# Patient Record
Sex: Female | Born: 1975 | Race: White | Hispanic: No | Marital: Married | Smoking: Former smoker
Health system: Southern US, Community
[De-identification: ages and names within clinical notes are randomized; demographics above are authoritative.]

## PROBLEM LIST (undated history)

## (undated) DIAGNOSIS — M351 Other overlap syndromes: Secondary | ICD-10-CM

## (undated) DIAGNOSIS — M069 Rheumatoid arthritis, unspecified: Secondary | ICD-10-CM

## (undated) DIAGNOSIS — I10 Essential (primary) hypertension: Secondary | ICD-10-CM

## (undated) DIAGNOSIS — R000 Tachycardia, unspecified: Secondary | ICD-10-CM

## (undated) DIAGNOSIS — E0590 Thyrotoxicosis, unspecified without thyrotoxic crisis or storm: Secondary | ICD-10-CM

## (undated) HISTORY — PX: TUBAL LIGATION: SHX77

## (undated) HISTORY — PX: BILATERAL CARPAL TUNNEL RELEASE: SHX6508

## (undated) HISTORY — DX: Other overlap syndromes: M35.1

## (undated) HISTORY — DX: Essential (primary) hypertension: I10

---

## 2006-10-10 ENCOUNTER — Emergency Department: Payer: Self-pay | Admitting: Internal Medicine

## 2006-12-23 ENCOUNTER — Ambulatory Visit: Payer: Self-pay

## 2006-12-26 ENCOUNTER — Ambulatory Visit: Payer: Self-pay

## 2008-07-02 ENCOUNTER — Emergency Department: Payer: Self-pay | Admitting: Emergency Medicine

## 2010-10-21 IMAGING — CT CT ABD-PELV W/ CM
1 of 2 series · 15 of 32 positions shown, 20 images · non-contrast
Comparison: none

REASON FOR EXAM: (1) rlq pain; (2) rlq pain
COMMENTS:

[Series 3: appendicitis · axial · 0.77mm/px · z∈[+214,+631]mm · 15 of 157 slices shown, 20 images]
[im 9/157  soft-tissue]
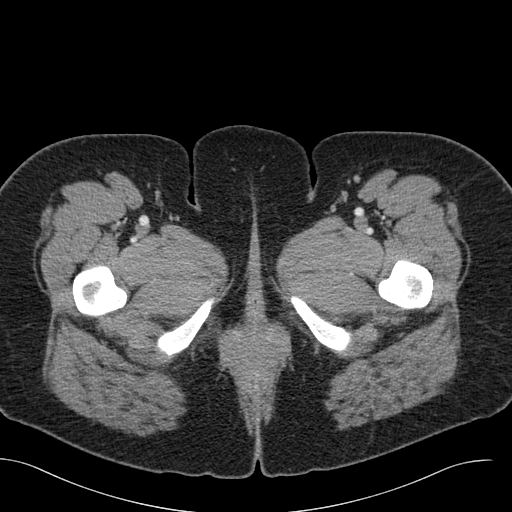
[im 9/157  bone]
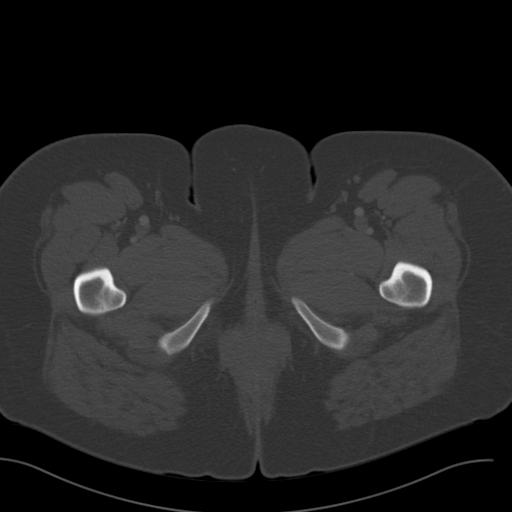
[im 17/157  soft-tissue]
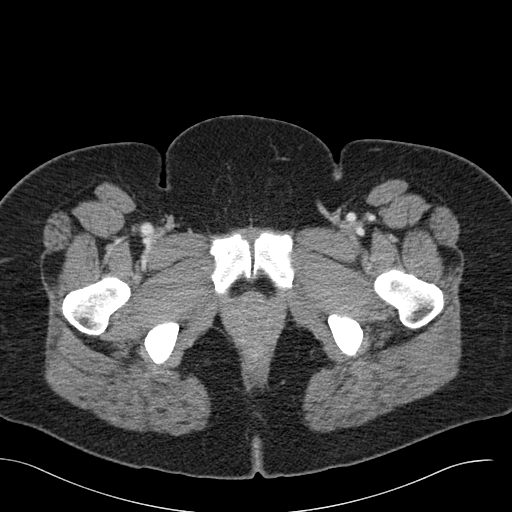
[im 33/157  soft-tissue]
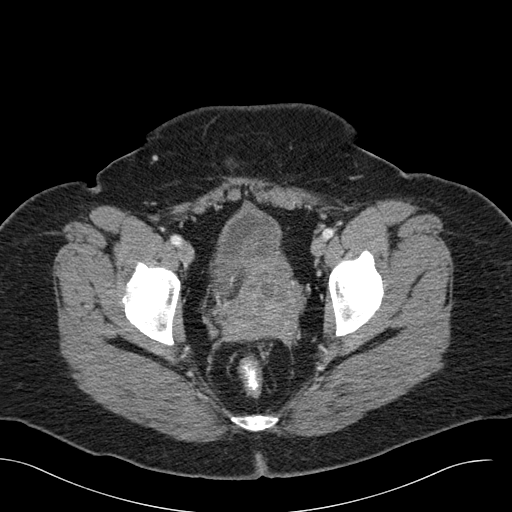
[im 42/157  soft-tissue]
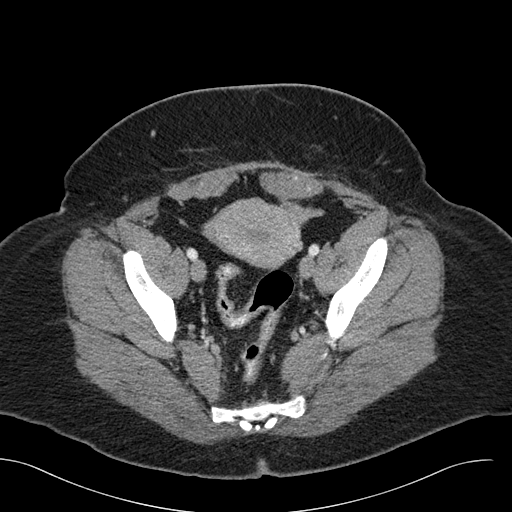
[im 50/157  soft-tissue]
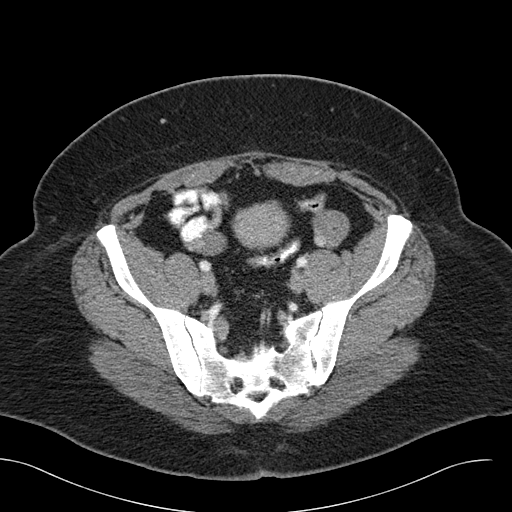
[im 66/157  soft-tissue]
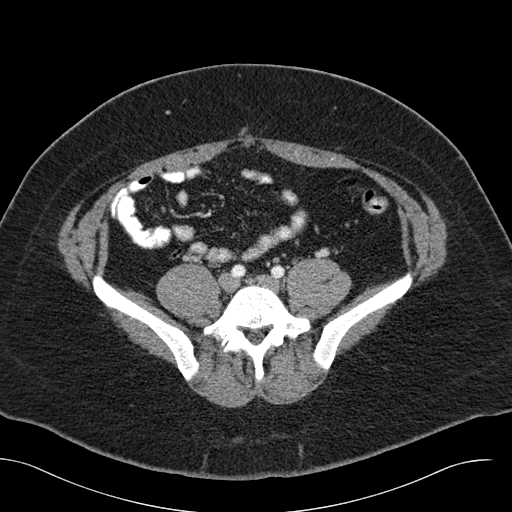
[im 74/157  soft-tissue]
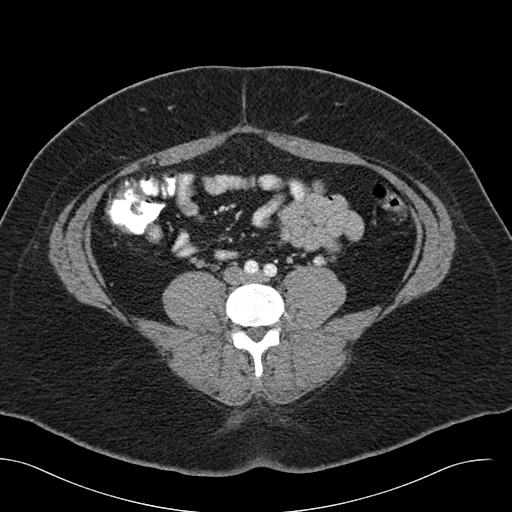
[im 83/157  soft-tissue]
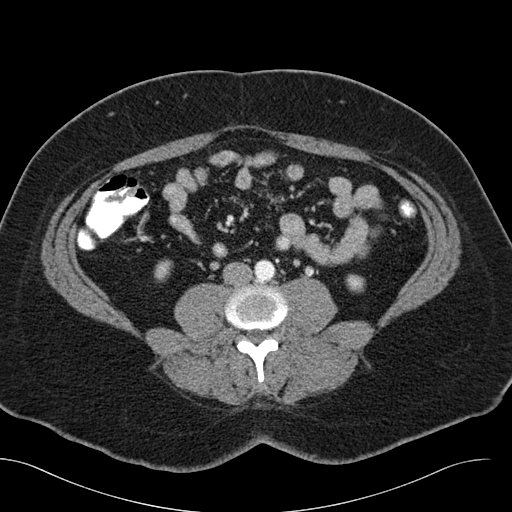
[im 91/157  soft-tissue]
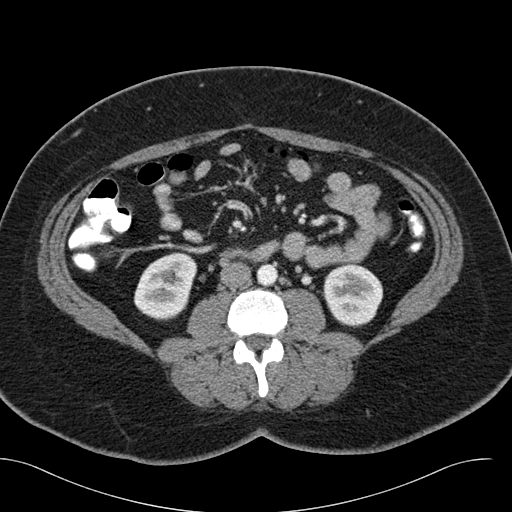
[im 91/157  bone]
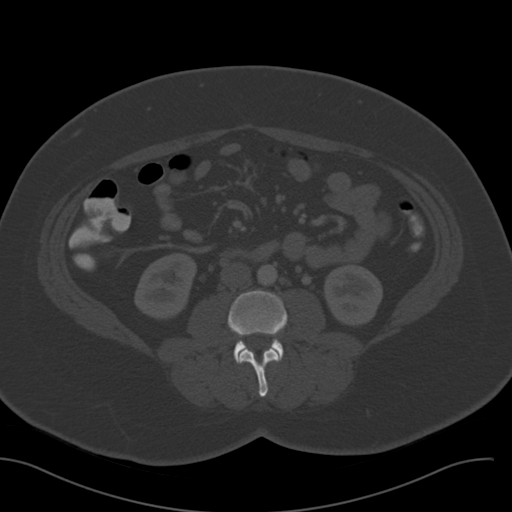
[im 107/157  soft-tissue]
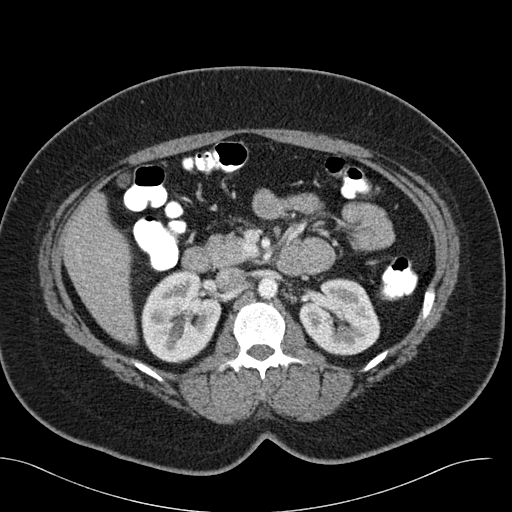
[im 115/157  soft-tissue]
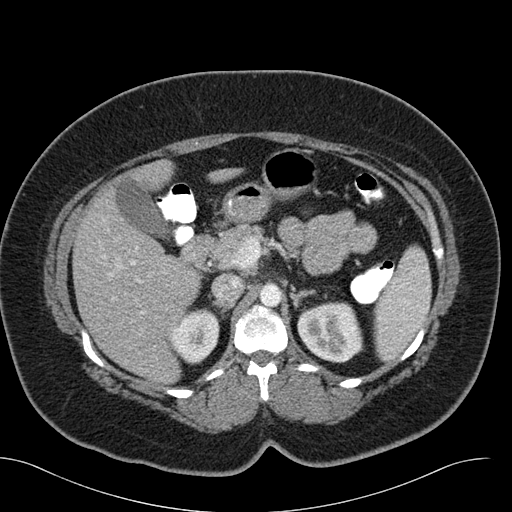
[im 124/157  soft-tissue]
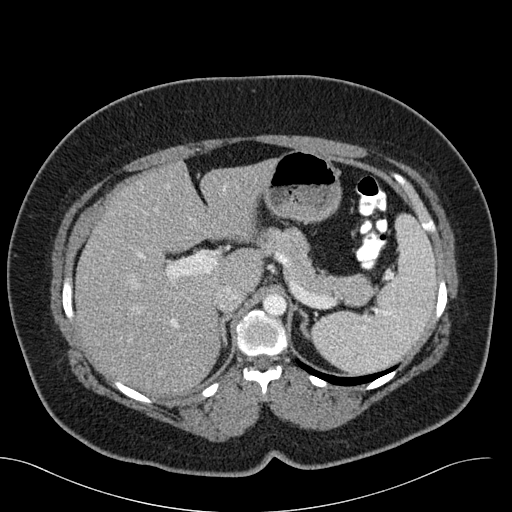
[im 124/157  lung]
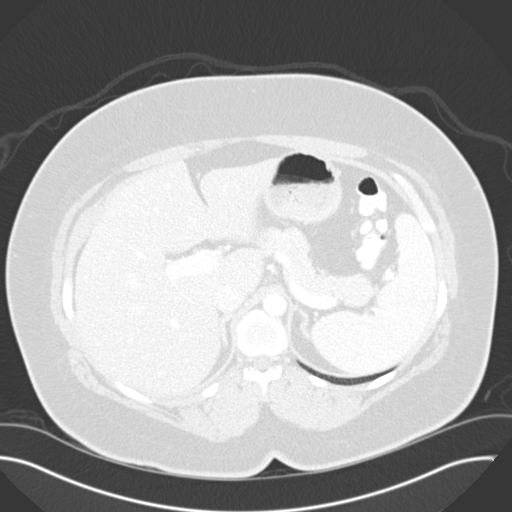
[im 132/157  lung]
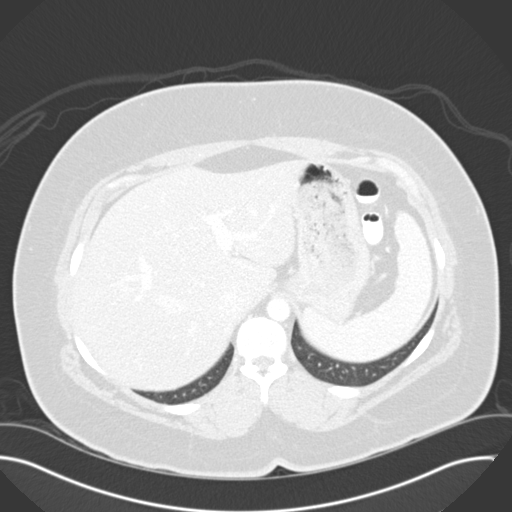
[im 140/157  soft-tissue]
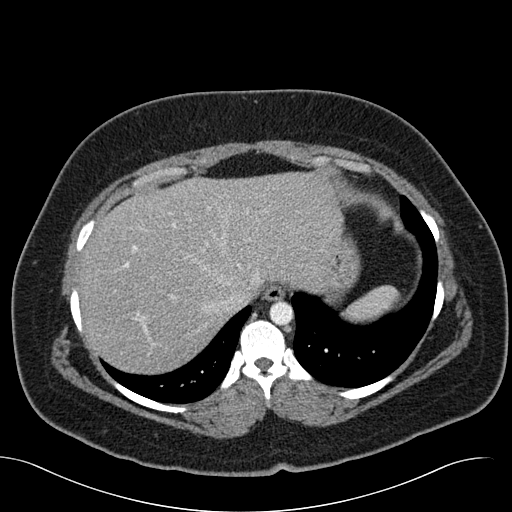
[im 140/157  lung]
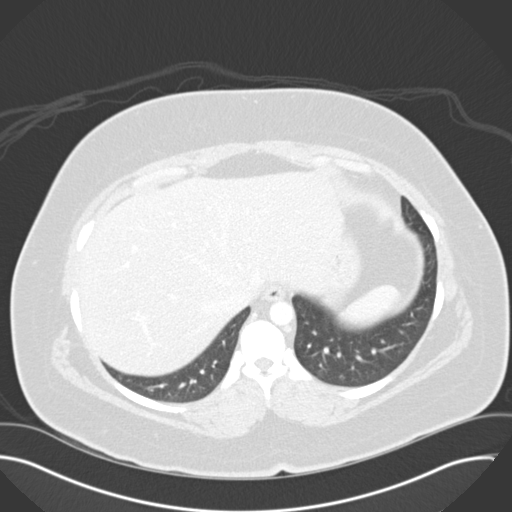
[im 148/157  soft-tissue]
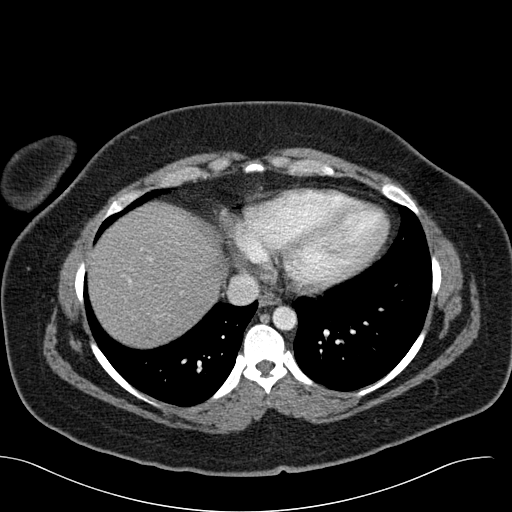
[im 148/157  lung]
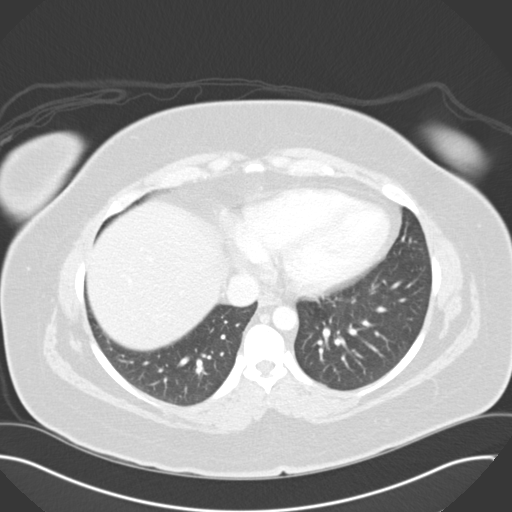

[15 of 32 positions shown; findings below may reference images not displayed]

PROCEDURE:     CT  - CT ABDOMEN / PELVIS  W  - July 02, 2008  [DATE]

RESULT:     CT of the abdomen and pelvis is performed utilizing oral
contrast and 100 ml of [id number] iodinated intravenous contrast. Images are
reconstructed at 3 mm slice thickness and compared to a study dated
10/10/2006.

There is no urinary obstruction or stone evident. There is no bowel
obstruction. There is no acute inflammation. The appendix is normal. The
aorta is unremarkable. There are multiple nonenlarged retroperitoneal lymph
nodes demonstrated. There is no abscess, ascites or free air. The urinary
bladder, liver, spleen, pancreas, kidneys, adrenal glands and gallbladder
appear unremarkable.
IMPRESSION: Unremarkable CT of the abdomen and pelvis.

## 2012-11-18 DIAGNOSIS — M545 Low back pain, unspecified: Secondary | ICD-10-CM | POA: Insufficient documentation

## 2013-01-06 DIAGNOSIS — R000 Tachycardia, unspecified: Secondary | ICD-10-CM | POA: Insufficient documentation

## 2013-01-07 DIAGNOSIS — R002 Palpitations: Secondary | ICD-10-CM | POA: Insufficient documentation

## 2013-06-15 DIAGNOSIS — E669 Obesity, unspecified: Secondary | ICD-10-CM | POA: Insufficient documentation

## 2013-06-15 DIAGNOSIS — E781 Pure hyperglyceridemia: Secondary | ICD-10-CM | POA: Insufficient documentation

## 2013-06-15 DIAGNOSIS — K219 Gastro-esophageal reflux disease without esophagitis: Secondary | ICD-10-CM | POA: Insufficient documentation

## 2013-06-15 DIAGNOSIS — E039 Hypothyroidism, unspecified: Secondary | ICD-10-CM | POA: Insufficient documentation

## 2013-06-15 DIAGNOSIS — M722 Plantar fascial fibromatosis: Secondary | ICD-10-CM | POA: Insufficient documentation

## 2013-06-15 DIAGNOSIS — L209 Atopic dermatitis, unspecified: Secondary | ICD-10-CM | POA: Insufficient documentation

## 2013-07-02 ENCOUNTER — Emergency Department: Payer: Self-pay | Admitting: Emergency Medicine

## 2013-07-02 LAB — CBC WITH DIFFERENTIAL/PLATELET
Basophil #: 0.1 10*3/uL (ref 0.0–0.1)
Basophil %: 0.9 %
EOS PCT: 1.8 %
Eosinophil #: 0.2 10*3/uL (ref 0.0–0.7)
HCT: 40.7 % (ref 35.0–47.0)
HGB: 13.5 g/dL (ref 12.0–16.0)
LYMPHS PCT: 26.3 %
Lymphocyte #: 2.8 10*3/uL (ref 1.0–3.6)
MCH: 29.4 pg (ref 26.0–34.0)
MCHC: 33.2 g/dL (ref 32.0–36.0)
MCV: 89 fL (ref 80–100)
MONO ABS: 0.8 x10 3/mm (ref 0.2–0.9)
Monocyte %: 7.8 %
NEUTROS ABS: 6.7 10*3/uL — AB (ref 1.4–6.5)
Neutrophil %: 63.2 %
Platelet: 353 10*3/uL (ref 150–440)
RBC: 4.59 10*6/uL (ref 3.80–5.20)
RDW: 13.1 % (ref 11.5–14.5)
WBC: 10.7 10*3/uL (ref 3.6–11.0)

## 2013-07-02 LAB — URINALYSIS, COMPLETE
BACTERIA: NONE SEEN
BILIRUBIN, UR: NEGATIVE
Blood: NEGATIVE
Glucose,UR: NEGATIVE mg/dL (ref 0–75)
NITRITE: NEGATIVE
PH: 5 (ref 4.5–8.0)
Protein: NEGATIVE
RBC,UR: 1 /HPF (ref 0–5)
Specific Gravity: 1.017 (ref 1.003–1.030)
WBC UR: NONE SEEN /HPF (ref 0–5)

## 2013-07-02 LAB — COMPREHENSIVE METABOLIC PANEL
ALK PHOS: 41 U/L — AB
ANION GAP: 7 (ref 7–16)
AST: 20 U/L (ref 15–37)
Albumin: 3.6 g/dL (ref 3.4–5.0)
BILIRUBIN TOTAL: 0.4 mg/dL (ref 0.2–1.0)
BUN: 12 mg/dL (ref 7–18)
Calcium, Total: 9.1 mg/dL (ref 8.5–10.1)
Chloride: 103 mmol/L (ref 98–107)
Co2: 28 mmol/L (ref 21–32)
Creatinine: 0.85 mg/dL (ref 0.60–1.30)
EGFR (African American): >60
EGFR (Non-African Amer.): >60
GLUCOSE: 85 mg/dL (ref 65–99)
Osmolality: 275 (ref 275–301)
Potassium: 4 mmol/L (ref 3.5–5.1)
SGPT (ALT): 27 U/L (ref 12–78)
Sodium: 138 mmol/L (ref 136–145)
Total Protein: 8 g/dL (ref 6.4–8.2)

## 2013-07-02 LAB — PREGNANCY, URINE: PREGNANCY TEST, URINE: NEGATIVE m[IU]/mL

## 2013-08-10 DIAGNOSIS — J309 Allergic rhinitis, unspecified: Secondary | ICD-10-CM | POA: Insufficient documentation

## 2015-10-21 IMAGING — US US PELV - US TRANSVAGINAL
1 series · 13 of 25 positions shown · non-contrast
Comparison: CT of the abdomen and pelvis performed 07/02/2008

CLINICAL DATA: Right lower quadrant abdominal pain.

EXAM:
TRANSABDOMINAL AND TRANSVAGINAL ULTRASOUND OF PELVIS
TECHNIQUE: Both transabdominal and transvaginal ultrasound examinations of the
pelvis were performed. Transabdominal technique was performed for
global imaging of the pelvis including uterus, ovaries, adnexal
regions, and pelvic cul-de-sac. It was necessary to proceed with
endovaginal exam following the transabdominal exam to visualize the
uterus and ovaries in greater detail.

[Series 1: us pelv - us transvaginal · 0.28mm/px · 13 of 50 slices shown]
[im 1/50]
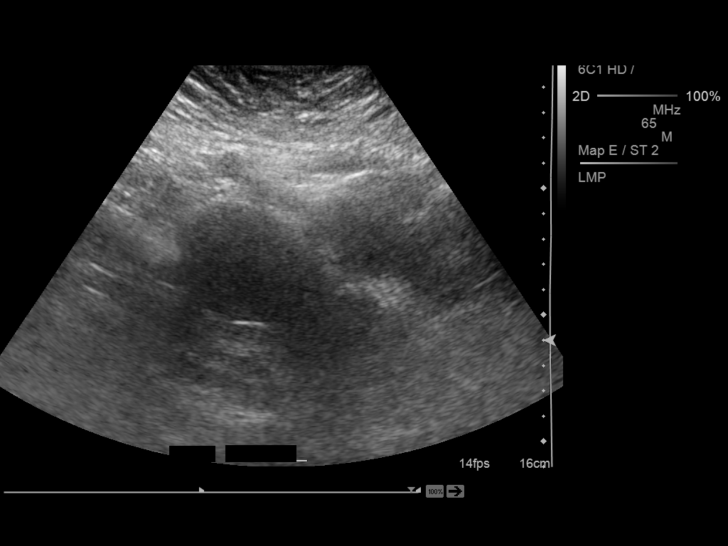
[im 5/50]
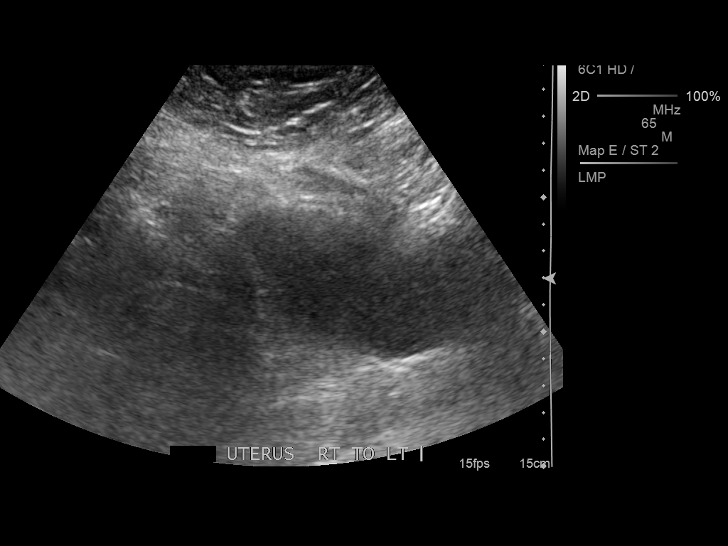
[im 9/50]
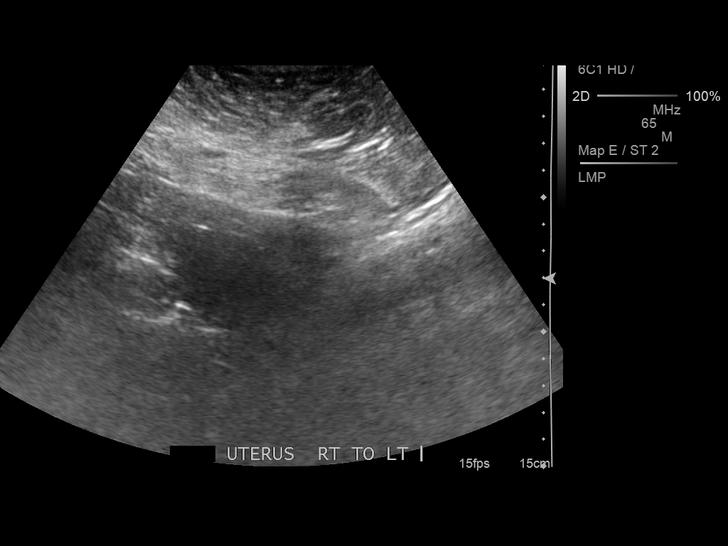
[im 13/50]
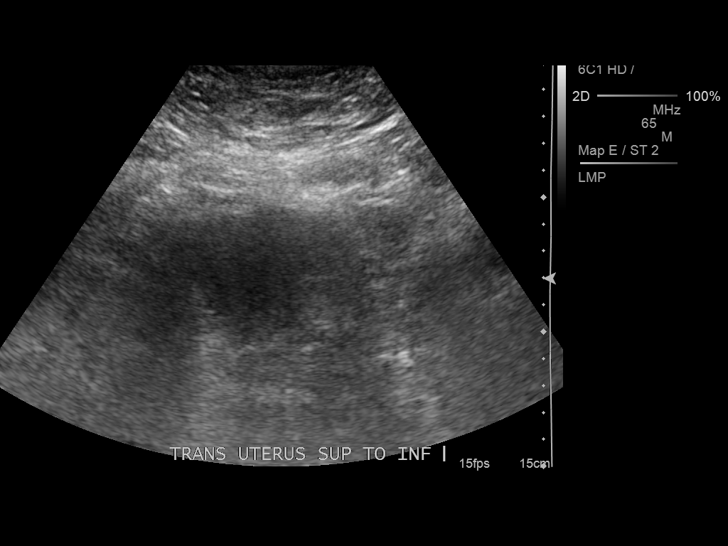
[im 17/50]
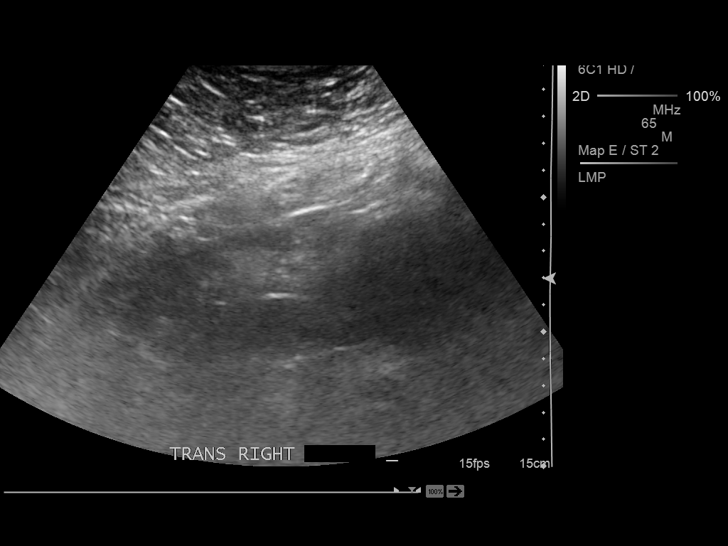
[im 21/50]
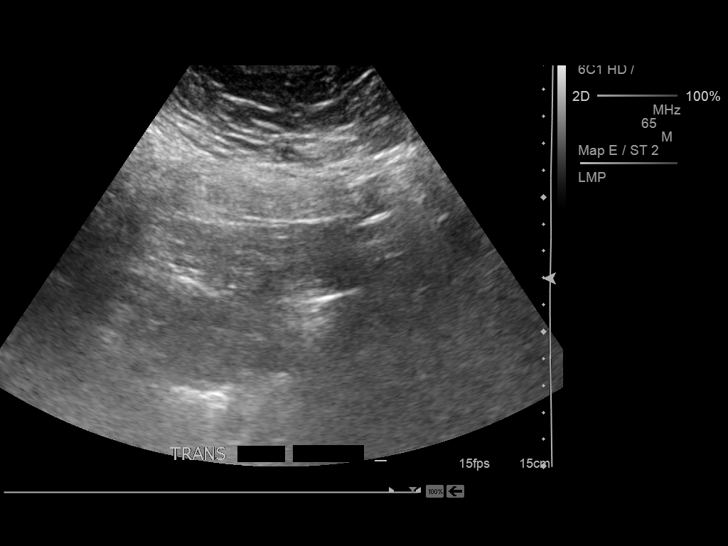
[im 25/50]
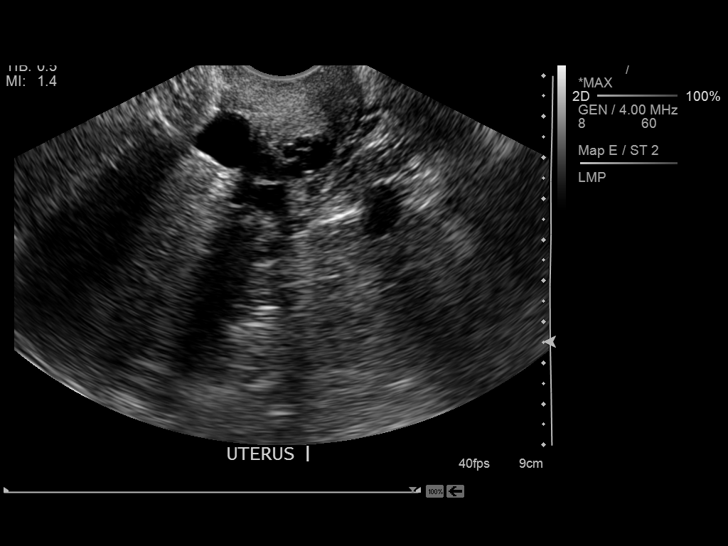
[im 29/50]
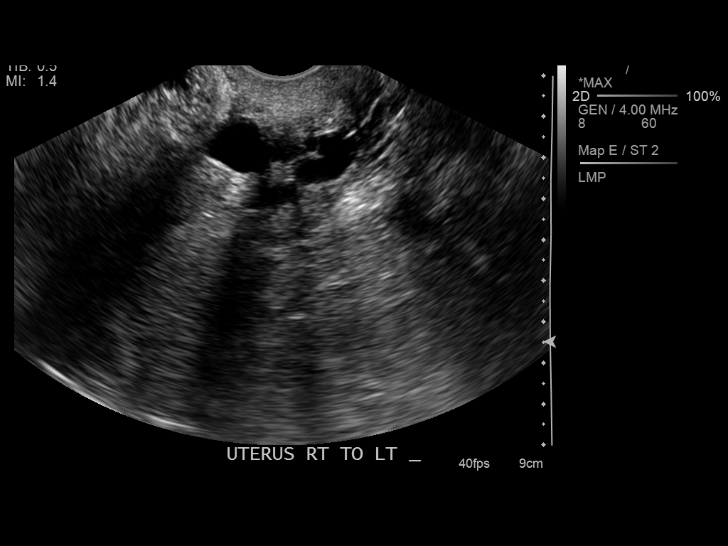
[im 33/50]
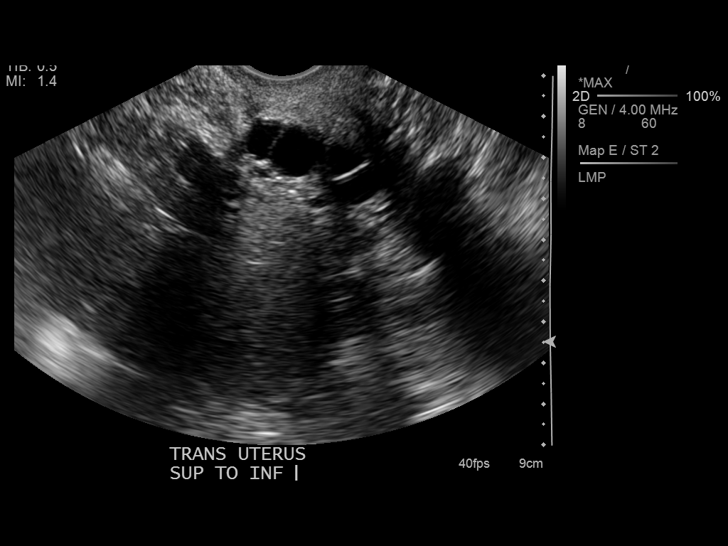
[im 37/50]
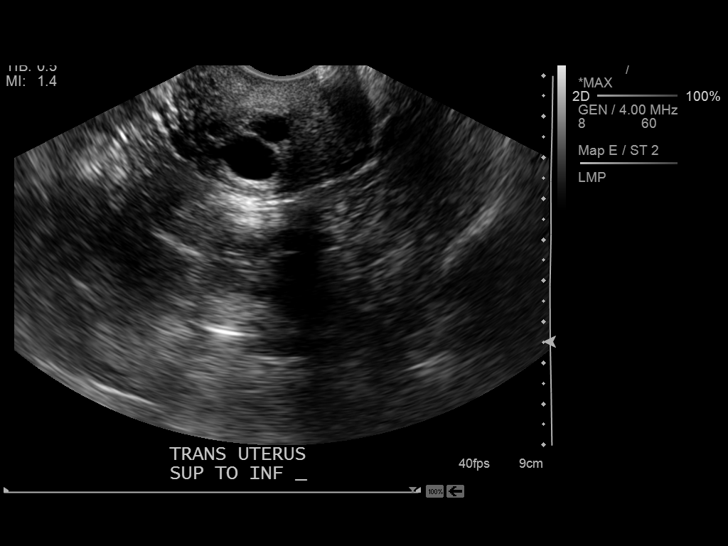
[im 41/50]
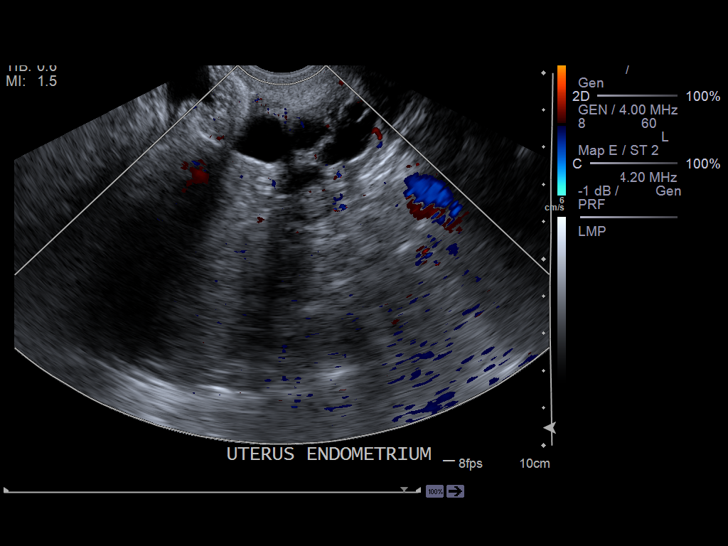
[im 45/50]
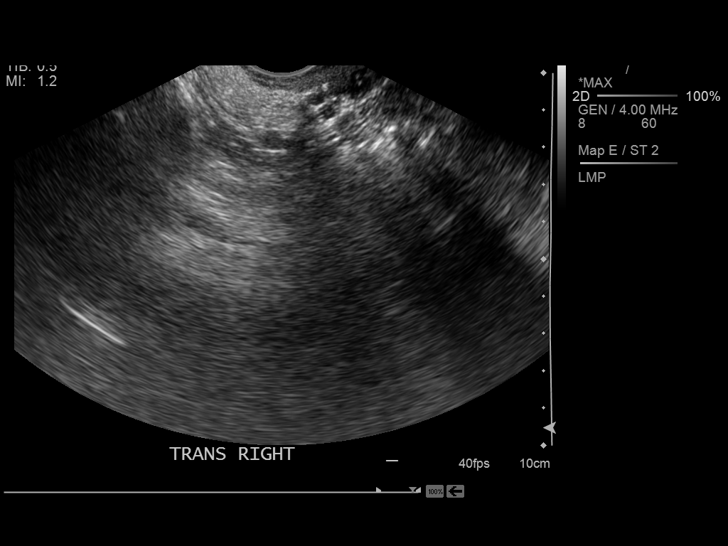
[im 50/50]
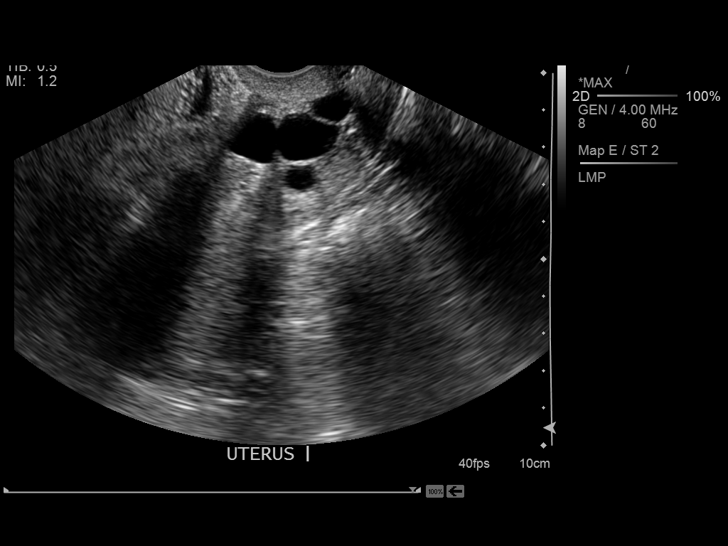

[13 of 25 positions shown; findings below may reference images not displayed]

FINDINGS: Uterus

Measurements: 8.2 x 4.4 x 5.1 cm. No fibroids or other mass
visualized. Several nabothian cysts are seen at the cervix.

Endometrium

Thickness: 1.5 cm. Diffusely thickened, without focal mass. The
patient describes a history of irregular menses due to thyroid
issues.

Right ovary

Not visualized.

Left ovary

Not visualized.

Other findings

No free fluid is seen within the pelvic cul-de-sac.
IMPRESSION: 1. Uterus remains normal in size. Mildly thickened endometrial echo
complex, without evidence of focal mass.
2. Ovaries not visualized due to the patient's habitus and overlying
bowel gas. No ancillary evidence for ovarian torsion.

## 2016-08-18 ENCOUNTER — Emergency Department: Payer: BC Managed Care – PPO

## 2016-08-18 ENCOUNTER — Emergency Department
Admission: EM | Admit: 2016-08-18 | Discharge: 2016-08-18 | Disposition: A | Discharge status: Home / Self Care | Payer: BC Managed Care – PPO | Attending: Emergency Medicine | Admitting: Emergency Medicine

## 2016-08-18 ENCOUNTER — Encounter: Payer: Self-pay | Admitting: Emergency Medicine

## 2016-08-18 DIAGNOSIS — R1032 Left lower quadrant pain: Secondary | ICD-10-CM

## 2016-08-18 DIAGNOSIS — Z87891 Personal history of nicotine dependence: Secondary | ICD-10-CM | POA: Diagnosis not present

## 2016-08-18 DIAGNOSIS — N200 Calculus of kidney: Secondary | ICD-10-CM | POA: Diagnosis not present

## 2016-08-18 HISTORY — DX: Tachycardia, unspecified: R00.0

## 2016-08-18 HISTORY — DX: Thyrotoxicosis, unspecified without thyrotoxic crisis or storm: E05.90

## 2016-08-18 HISTORY — DX: Rheumatoid arthritis, unspecified: M06.9

## 2016-08-18 LAB — COMPREHENSIVE METABOLIC PANEL
ALBUMIN: 3.5 g/dL (ref 3.5–5.0)
ALK PHOS: 38 U/L (ref 38–126)
ALT: 21 U/L (ref 14–54)
AST: 22 U/L (ref 15–41)
Anion gap: 6 (ref 5–15)
BILIRUBIN TOTAL: 0.4 mg/dL (ref 0.3–1.2)
BUN: 13 mg/dL (ref 6–20)
CO2: 26 mmol/L (ref 22–32)
Calcium: 8.5 mg/dL — ABNORMAL LOW (ref 8.9–10.3)
Chloride: 105 mmol/L (ref 101–111)
Creatinine, Ser: 0.67 mg/dL (ref 0.44–1.00)
GFR calc Af Amer: >60 mL/min (ref >60)
GFR calc non Af Amer: >60 mL/min (ref >60)
GLUCOSE: 126 mg/dL — AB (ref 65–99)
Potassium: 4.1 mmol/L (ref 3.5–5.1)
Sodium: 137 mmol/L (ref 135–145)
Total Protein: 7.3 g/dL (ref 6.5–8.1)

## 2016-08-18 LAB — CBC
HEMATOCRIT: 39.3 % (ref 35.0–47.0)
Hemoglobin: 13.5 g/dL (ref 12.0–16.0)
MCH: 29.3 pg (ref 26.0–34.0)
MCHC: 34.3 g/dL (ref 32.0–36.0)
MCV: 85.4 fL (ref 80.0–100.0)
Platelets: 345 10*3/uL (ref 150–440)
RBC: 4.6 MIL/uL (ref 3.80–5.20)
RDW: 13.7 % (ref 11.5–14.5)
WBC: 7.8 10*3/uL (ref 3.6–11.0)

## 2016-08-18 LAB — URINALYSIS, COMPLETE (UACMP) WITH MICROSCOPIC
BACTERIA UA: NONE SEEN
Bilirubin Urine: NEGATIVE
Glucose, UA: NEGATIVE mg/dL
Ketones, ur: NEGATIVE mg/dL
Leukocytes, UA: NEGATIVE
Nitrite: NEGATIVE
Protein, ur: 30 mg/dL — AB
SPECIFIC GRAVITY, URINE: 1.012 (ref 1.005–1.030)
pH: 5 (ref 5.0–8.0)

## 2016-08-18 LAB — LIPASE, BLOOD: Lipase: 21 U/L (ref 11–51)

## 2016-08-18 LAB — POCT PREGNANCY, URINE: Preg Test, Ur: NEGATIVE

## 2016-08-18 MED ORDER — ONDANSETRON 4 MG PO TBDP: 4.0000 mg | ORAL_TABLET | Freq: Three times a day (TID) | ORAL | 0 refills | Status: AC | PRN

## 2016-08-18 MED ORDER — OXYCODONE-ACETAMINOPHEN 5-325 MG PO TABS: 1.0000 | ORAL_TABLET | Freq: Four times a day (QID) | ORAL | 0 refills | Status: AC | PRN

## 2016-08-18 MED ORDER — OXYCODONE HCL 5 MG PO TABS
5.0000 mg | ORAL_TABLET | Freq: Once | ORAL | Status: AC
  Administered 2016-08-18: 5 mg via ORAL
  Filled 2016-08-18: qty 1

## 2016-08-18 MED ORDER — ACETAMINOPHEN 500 MG PO TABS
1000.0000 mg | ORAL_TABLET | Freq: Once | ORAL | Status: AC
  Administered 2016-08-18: 1000 mg via ORAL
  Filled 2016-08-18: qty 2

## 2016-08-18 MED ORDER — ONDANSETRON 4 MG PO TBDP: 4.0000 mg | ORAL_TABLET | Freq: Once | ORAL | Status: DC | PRN

## 2016-08-18 MED ORDER — IBUPROFEN 600 MG PO TABS: 600.0000 mg | ORAL_TABLET | Freq: Four times a day (QID) | ORAL | 0 refills | Status: AC | PRN

## 2016-08-18 MED ORDER — SODIUM CHLORIDE 0.9 % IV BOLUS (SEPSIS)
1000.0000 mL | Freq: Once | INTRAVENOUS | Status: AC
  Administered 2016-08-18: 1000 mL via INTRAVENOUS

## 2016-08-18 MED ORDER — ONDANSETRON HCL 4 MG/2ML IJ SOLN
4.0000 mg | Freq: Once | INTRAMUSCULAR | Status: AC
  Administered 2016-08-18: 4 mg via INTRAVENOUS
  Filled 2016-08-18: qty 2

## 2016-08-18 MED ORDER — KETOROLAC TROMETHAMINE 30 MG/ML IJ SOLN
30.0000 mg | Freq: Once | INTRAMUSCULAR | Status: AC
  Administered 2016-08-18: 30 mg via INTRAVENOUS
  Filled 2016-08-18: qty 1

## 2016-08-18 NOTE — ED Notes (Signed)
Patient transported to Ultrasound 

## 2016-08-18 NOTE — ED Provider Notes (Signed)
[facility] Emergency Department Provider Note  ____________________________________________  Time seen: Approximately 3:31 PM  I have reviewed the triage vital signs and the nursing notes.   HISTORY  Chief Complaint Abdominal Pain   HPI Jeanne Bush is a 41 y.o. female who presents for evaluation of left lower quadrant abdominal pain. Patient reports that the pain happened suddenly this morning after she had a bowel movement. The pain is dull, constant, severe, located in the left lower quadrant, radiating to her left lower back. She denies ever having similar pain in the past. Patient had one episode of vomiting and continues to feel nauseated. She also endorses urinary frequency for a week but no dysuria, no hematuria, no flank pain, no fever or chills, nor constipation or diarrhea. No vaginal discharge. Patient's prior abdominal surgery includes 2 C-sections. No abdominal distention. Currently she has 7/10 pain.  Past Medical History:  Diagnosis Date  . Hyperthyroidism   . Rheumatoid arthritis (HCC)   . Tachycardia     There are no active problems to display for this patient.   Past Surgical History:  Procedure Laterality Date  . CESAREAN SECTION    . TUBAL LIGATION      Prior to Admission medications   Medication Sig Start Date End Date Taking? Authorizing Provider  ibuprofen (ADVIL,MOTRIN) 600 MG tablet Take 1 tablet (600 mg total) by mouth every 6 (six) hours as needed for mild pain or moderate pain. 08/18/16   Nita SickleVeronese, [place], MD  ondansetron (ZOFRAN ODT) 4 MG disintegrating tablet Take 1 tablet (4 mg total) by mouth every 8 (eight) hours as needed for nausea or vomiting. 08/18/16   Don PerkingVeronese, [place], MD  oxyCODONE-acetaminophen (ROXICET) 5-325 MG tablet Take 1 tablet by mouth every 6 (six) hours as needed. 08/18/16 08/18/17  Nita SickleVeronese, [place], MD    Allergies Patient has no known allergies.  History reviewed. No pertinent family  history.  Social History Social History  Substance Use Topics  . Smoking status: Former Games developermoker  . Smokeless tobacco: Never Used  . Alcohol use Yes    Review of Systems  Constitutional: Negative for fever. Eyes: Negative for visual changes. ENT: Negative for sore throat. Neck: No neck pain  Cardiovascular: Negative for chest pain. Respiratory: Negative for shortness of breath. Gastrointestinal: + LLQ abdominal pain, nausea, and vomiting. No diarrhea. Genitourinary: Negative for dysuria. Musculoskeletal: Negative for back pain. Skin: Negative for rash. Neurological: Negative for headaches, weakness or numbness. Psych: No SI or HI  ____________________________________________   PHYSICAL EXAM:  VITAL SIGNS: ED Triage Vitals  Enc Vitals Group     BP 08/18/16 1342 130/83     Pulse Rate 08/18/16 1340 83     Resp 08/18/16 1340 (!) 24     Temp 08/18/16 1341 98.4 F (36.9 C)     Temp Source 08/18/16 1341 Oral     SpO2 08/18/16 1340 99 %     Weight 08/18/16 1340 228 lb (103.4 kg)     Height 08/18/16 1340 5\' 1"  (1.549 m)     Head Circumference --      Peak Flow --      Pain Score 08/18/16 1340 9     Pain Loc --      Pain Edu? --      Excl. in GC? --     Constitutional: Alert and oriented. Well appearing and in no apparent distress. HEENT:      Head: Normocephalic and atraumatic.  Eyes: Conjunctivae are normal. Sclera is non-icteric.       Mouth/Throat: Mucous membranes are moist.       Neck: Supple with no signs of meningismus. Cardiovascular: Regular rate and rhythm. No murmurs, gallops, or rubs. 2+ symmetrical distal pulses are present in all extremities. No JVD. Respiratory: Normal respiratory effort. Lungs are clear to auscultation bilaterally. No wheezes, crackles, or rhonchi.  Gastrointestinal: Soft, mild LLQ tenderness to palpation, and non distended with positive bowel sounds. No rebound or guarding. Genitourinary: No CVA tenderness. Musculoskeletal:  Nontender with normal range of motion in all extremities. No edema, cyanosis, or erythema of extremities. Neurologic: Normal speech and language. Face is symmetric. Moving all extremities. No gross focal neurologic deficits are appreciated. Skin: Skin is warm, dry and intact. No rash noted. Psychiatric: Mood and affect are normal. Speech and behavior are normal.  ____________________________________________   LABS (all labs ordered are listed, but only abnormal results are displayed)  Labs Reviewed  COMPREHENSIVE METABOLIC PANEL - Abnormal; Notable for the following:       Result Value   Glucose, Bld 126 (*)    Calcium 8.5 (*)    All other components within normal limits  URINALYSIS, COMPLETE (UACMP) WITH MICROSCOPIC - Abnormal; Notable for the following:    Color, Urine YELLOW (*)    APPearance HAZY (*)    Hgb urine dipstick LARGE (*)    Protein, ur 30 (*)    Squamous Epithelial / LPF 0-5 (*)    All other components within normal limits  LIPASE, BLOOD  CBC  POC URINE PREG, ED  POCT PREGNANCY, URINE   ____________________________________________  EKG  none  ____________________________________________  RADIOLOGY  TVUS: Stable nabothian cysts from prior exam.  Nonvisualization of left ovary.  Normal-appearing right ovary and uterus.  CT renal:  2 mm stone at the left UVJ causing low-grade obstruction ____________________________________________   PROCEDURES  Procedure(s) performed: None Procedures Critical Care performed:  None ____________________________________________   INITIAL IMPRESSION / ASSESSMENT AND PLAN / ED COURSE  41 y.o. female who presents for evaluation of sudden onset of left lower quadrant abdominal pain radiating to left lower back. Patient is well-appearing, in no distress, has normal vital signs, she has mild left lower quadrant tenderness palpation with no rebound or guarding, no flank tenderness. Blood work showing normal CBC, CMP,  lipase, negative pregnancy test. Differential diagnoses including ovarian pathology such as torsion or hemorrhagic cyst vs kidney stone vs diverticulitis. Plan for TVUS, UA pending.   Clinical Course as of Aug 19 1847  Sat Aug 18, 2016  1753 Ultrasound negative for ovarian pathology however unable to visualize the left ovary. UA with large amount of blood. We'll sent for CT renal to rule out kidney stone.  [CV]  1847 CT showing 2 mm left-sided ureteral stone at the left UVJ. Patient reports that her pain is markedly improved at this time. We'll switch to by mouth pain medication and discharged home. No evidence of overlying infection. Normal kidney function.  [CV]    Clinical Course User Index [CV] Nita Sickle, MD    Pertinent labs & imaging results that were available during my care of the patient were reviewed by me and considered in my medical decision making (see chart for details).    ____________________________________________   FINAL CLINICAL IMPRESSION(S) / ED DIAGNOSES  Final diagnoses:  LLQ abdominal pain  Kidney stone      NEW MEDICATIONS STARTED DURING THIS VISIT:  New Prescriptions   IBUPROFEN (ADVIL,MOTRIN)  600 MG TABLET    Take 1 tablet (600 mg total) by mouth every 6 (six) hours as needed for mild pain or moderate pain.   ONDANSETRON (ZOFRAN ODT) 4 MG DISINTEGRATING TABLET    Take 1 tablet (4 mg total) by mouth every 8 (eight) hours as needed for nausea or vomiting.   OXYCODONE-ACETAMINOPHEN (ROXICET) 5-325 MG TABLET    Take 1 tablet by mouth every 6 (six) hours as needed.     Note:  This document was prepared using Dragon voice recognition software and may include unintentional dictation errors.    Nita Sickle, MD 08/18/16 [phone]

## 2016-08-18 NOTE — ED Triage Notes (Signed)
Pt c/o acute left lower abdominal pain that started today. Nausea but no vomiting.  Denies urinary symptoms.  No fevers that pt knows of.  Restless in triage.  Pain started after bowel movement today.

## 2016-08-18 NOTE — Discharge Instructions (Signed)

## 2016-10-12 DIAGNOSIS — M05741 Rheumatoid arthritis with rheumatoid factor of right hand without organ or systems involvement: Secondary | ICD-10-CM | POA: Insufficient documentation

## 2017-01-15 DIAGNOSIS — H5213 Myopia, bilateral: Secondary | ICD-10-CM | POA: Insufficient documentation

## 2017-01-15 DIAGNOSIS — T378X5A Adverse effect of other specified systemic anti-infectives and antiparasitics, initial encounter: Secondary | ICD-10-CM | POA: Insufficient documentation

## 2018-07-03 IMAGING — US US TRANSVAGINAL NON-OB
1 series · 14 of 25 positions shown · non-contrast
Comparison: 07/02/2013

CLINICAL DATA: Left lower quadrant pain

EXAM:
TRANSABDOMINAL AND TRANSVAGINAL ULTRASOUND OF PELVIS
DOPPLER ULTRASOUND OF OVARIES
TECHNIQUE: Both transabdominal and transvaginal ultrasound examinations of the
pelvis were performed. Transabdominal technique was performed for
global imaging of the pelvis including uterus, ovaries, adnexal
regions, and pelvic cul-de-sac.
It was necessary to proceed with endovaginal exam following the
transabdominal exam to visualize the ovaries. Color and duplex
Doppler ultrasound was utilized to evaluate blood flow to the
ovaries.

[Series 1: us transvaginal non-ob · 0.26mm/px · 14 of 77 slices shown]
[im 1/77]
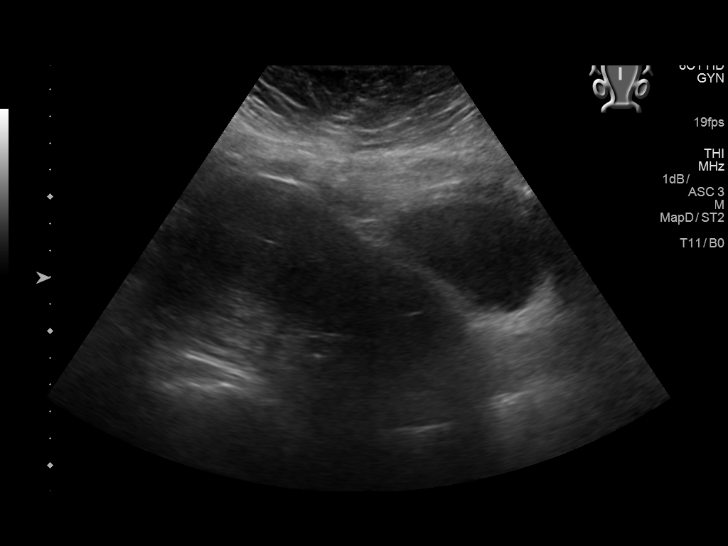
[im 7/77]
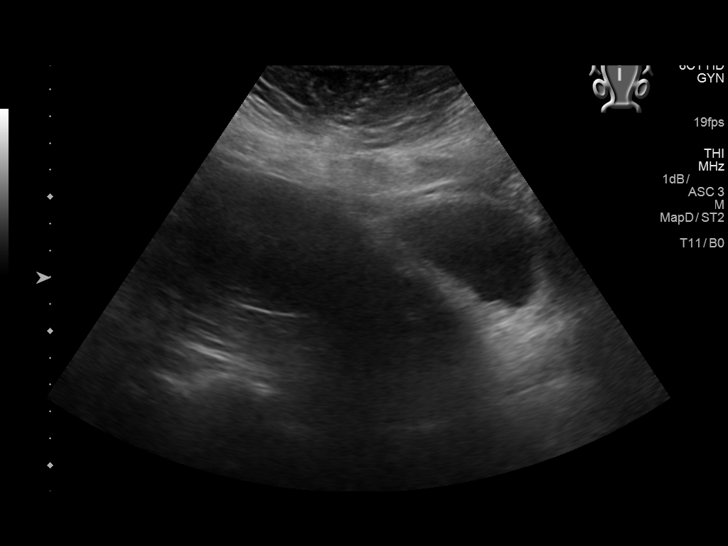
[im 13/77]
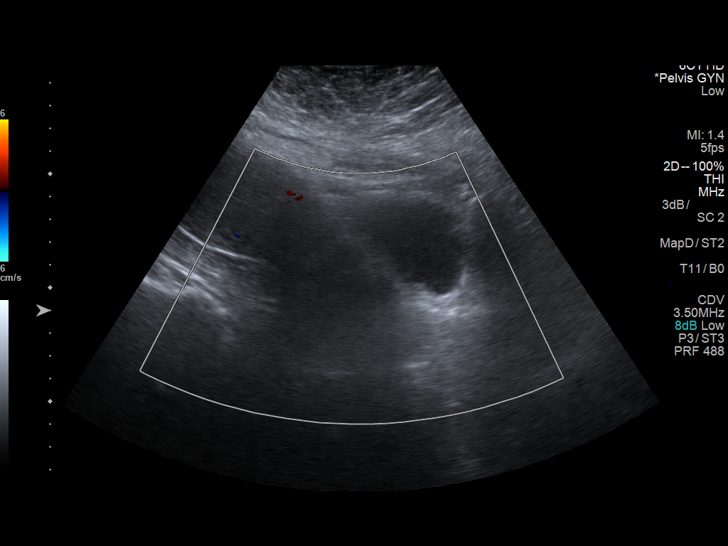
[im 20/77]
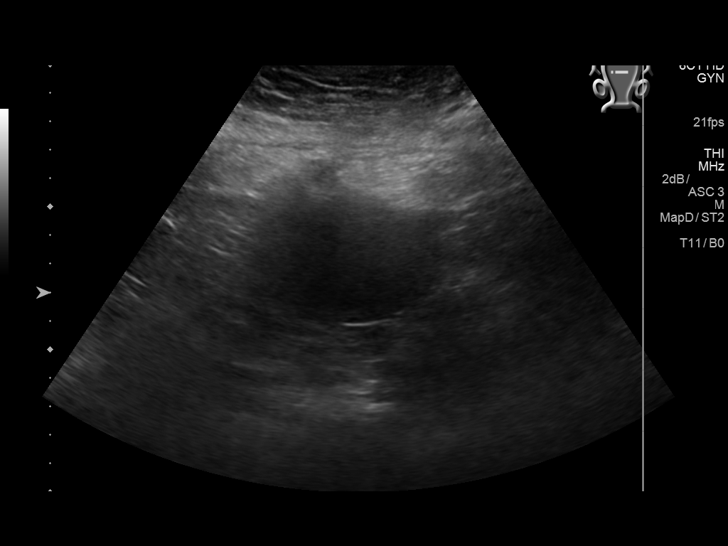
[im 26/77]
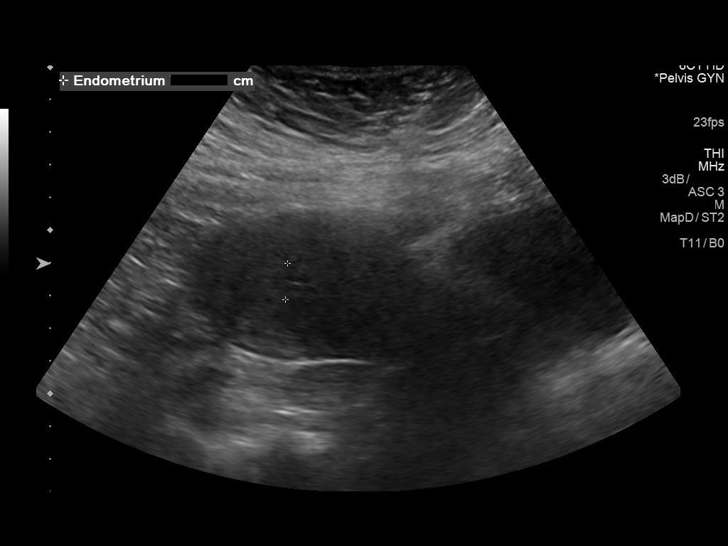
[im 29/77]
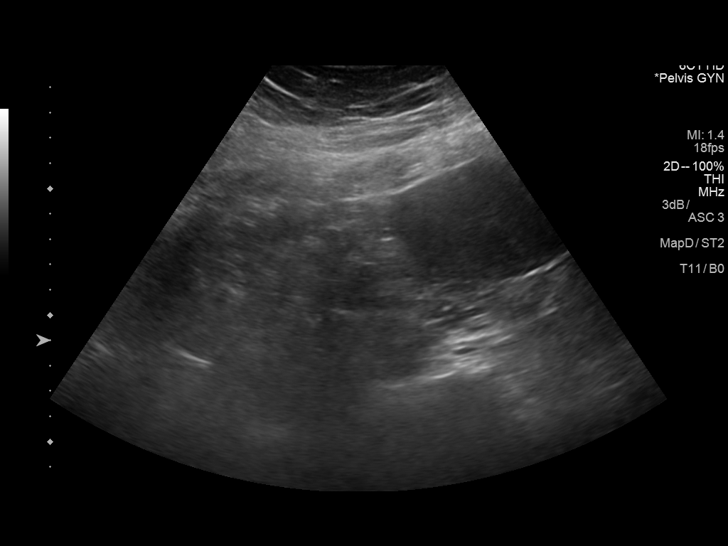
[im 35/77]
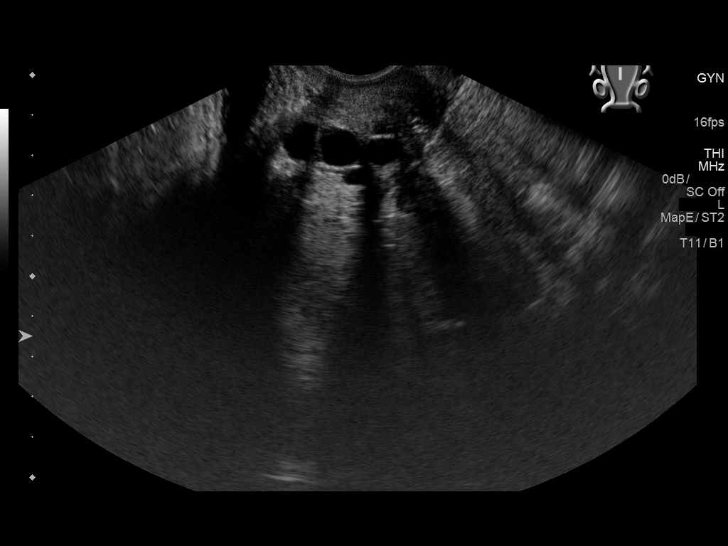
[im 42/77]
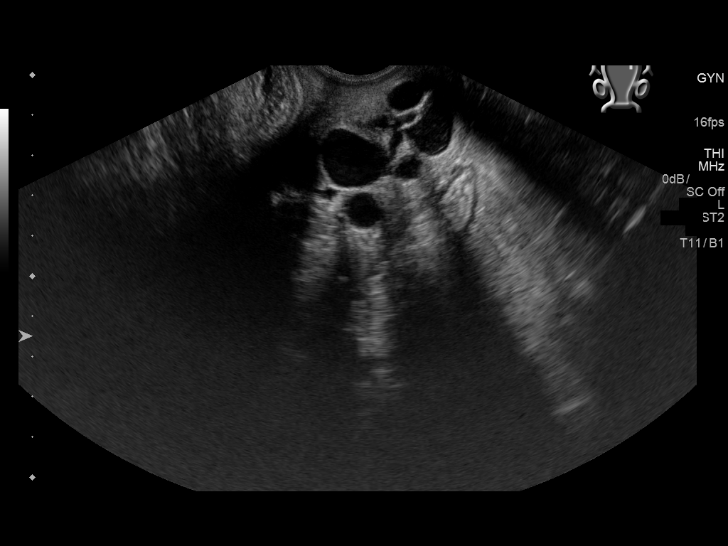
[im 48/77]
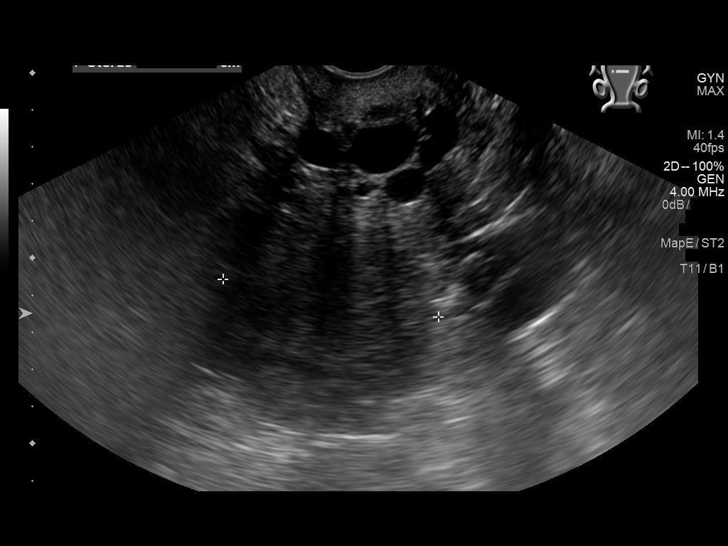
[im 51/77]
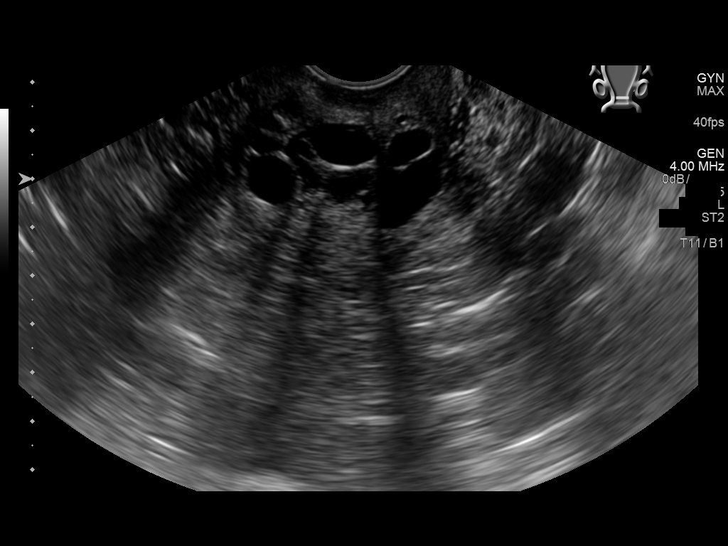
[im 58/77]
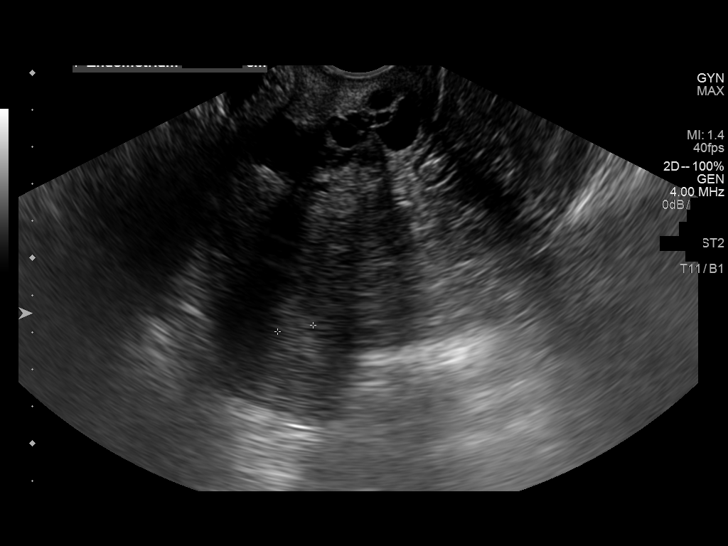
[im 64/77]
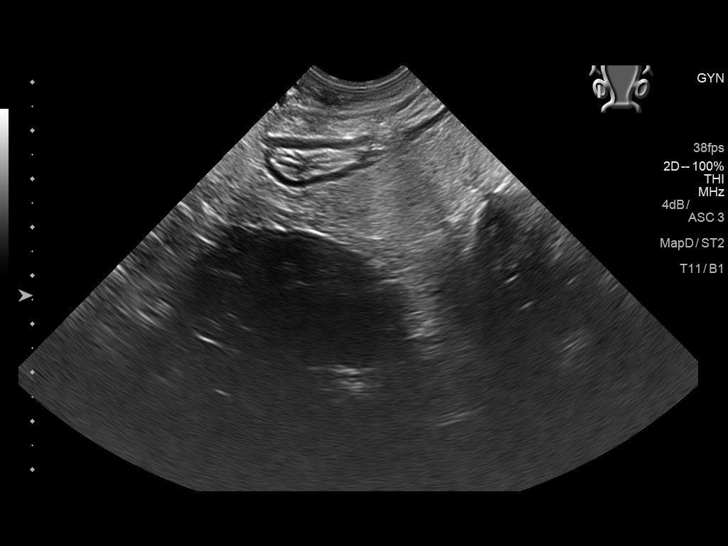
[im 70/77]
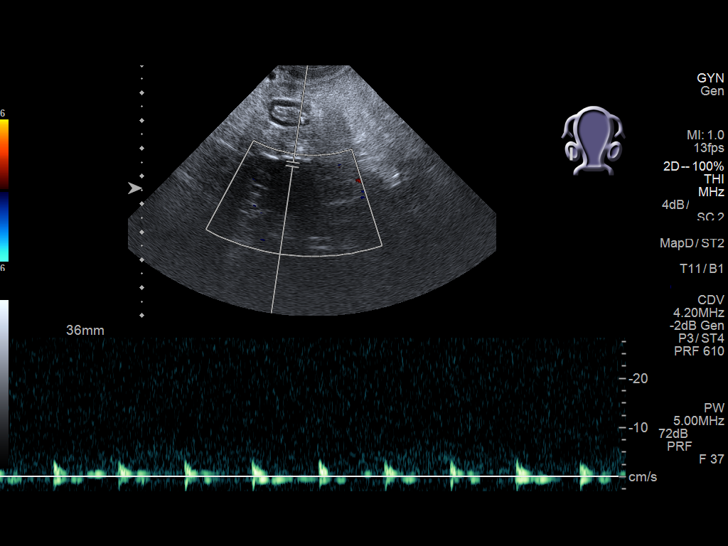
[im 77/77]
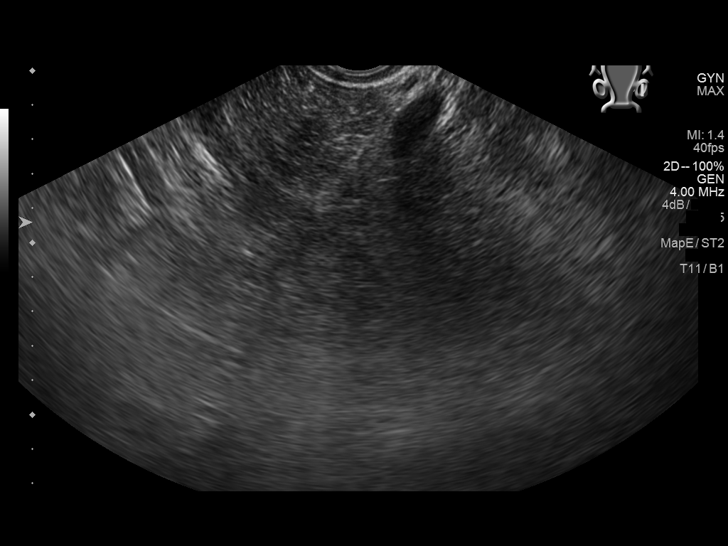

[14 of 25 positions shown; findings below may reference images not displayed]

FINDINGS: Uterus

Measurements: 12.4 x 4.3 x 7.3 cm.. Multiple nabothian cysts are
seen.

Endometrium

Thickness: 10 mm..  No focal abnormality visualized.

Right ovary

Measurements: 4.0 x 2.6 x 2.5 cm.. Normal appearance/no adnexal
mass.

Left ovary

Not visualized

Pulsed Doppler evaluation of the right ovary demonstrates normal
low-resistance arterial and venous waveforms.

Other findings

No abnormal free fluid.
IMPRESSION: Stable nabothian cysts from prior exam.

Nonvisualization of left ovary.

Normal-appearing right ovary and uterus.

## 2022-03-02 DIAGNOSIS — Z419 Encounter for procedure for purposes other than remedying health state, unspecified: Secondary | ICD-10-CM | POA: Diagnosis not present

## 2022-03-08 DIAGNOSIS — M05741 Rheumatoid arthritis with rheumatoid factor of right hand without organ or systems involvement: Secondary | ICD-10-CM | POA: Diagnosis not present

## 2022-03-08 DIAGNOSIS — Z148 Genetic carrier of other disease: Secondary | ICD-10-CM | POA: Insufficient documentation

## 2022-03-08 DIAGNOSIS — Z79899 Other long term (current) drug therapy: Secondary | ICD-10-CM | POA: Diagnosis not present

## 2022-03-08 DIAGNOSIS — E781 Pure hyperglyceridemia: Secondary | ICD-10-CM | POA: Diagnosis not present

## 2022-03-08 DIAGNOSIS — R002 Palpitations: Secondary | ICD-10-CM | POA: Diagnosis not present

## 2022-03-08 DIAGNOSIS — Z1331 Encounter for screening for depression: Secondary | ICD-10-CM | POA: Diagnosis not present

## 2022-03-08 DIAGNOSIS — M05742 Rheumatoid arthritis with rheumatoid factor of left hand without organ or systems involvement: Secondary | ICD-10-CM | POA: Diagnosis not present

## 2022-03-08 DIAGNOSIS — Z1211 Encounter for screening for malignant neoplasm of colon: Secondary | ICD-10-CM | POA: Diagnosis not present

## 2022-03-08 DIAGNOSIS — Z23 Encounter for immunization: Secondary | ICD-10-CM | POA: Diagnosis not present

## 2022-03-08 DIAGNOSIS — I7300 Raynaud's syndrome without gangrene: Secondary | ICD-10-CM | POA: Insufficient documentation

## 2022-03-08 DIAGNOSIS — J849 Interstitial pulmonary disease, unspecified: Secondary | ICD-10-CM | POA: Diagnosis not present

## 2022-03-08 DIAGNOSIS — M351 Other overlap syndromes: Secondary | ICD-10-CM | POA: Insufficient documentation

## 2022-03-08 DIAGNOSIS — E039 Hypothyroidism, unspecified: Secondary | ICD-10-CM | POA: Diagnosis not present

## 2022-03-08 DIAGNOSIS — Z1330 Encounter for screening examination for mental health and behavioral disorders, unspecified: Secondary | ICD-10-CM | POA: Diagnosis not present

## 2022-03-21 DIAGNOSIS — J849 Interstitial pulmonary disease, unspecified: Secondary | ICD-10-CM | POA: Diagnosis not present

## 2022-03-21 DIAGNOSIS — M05742 Rheumatoid arthritis with rheumatoid factor of left hand without organ or systems involvement: Secondary | ICD-10-CM | POA: Diagnosis not present

## 2022-03-21 DIAGNOSIS — M351 Other overlap syndromes: Secondary | ICD-10-CM | POA: Diagnosis not present

## 2022-03-21 DIAGNOSIS — M3500 Sicca syndrome, unspecified: Secondary | ICD-10-CM | POA: Insufficient documentation

## 2022-03-21 DIAGNOSIS — Z0000 Encounter for general adult medical examination without abnormal findings: Secondary | ICD-10-CM | POA: Diagnosis not present

## 2022-03-21 DIAGNOSIS — M05741 Rheumatoid arthritis with rheumatoid factor of right hand without organ or systems involvement: Secondary | ICD-10-CM | POA: Diagnosis not present

## 2022-04-02 DIAGNOSIS — Z419 Encounter for procedure for purposes other than remedying health state, unspecified: Secondary | ICD-10-CM | POA: Diagnosis not present

## 2022-04-23 DIAGNOSIS — E039 Hypothyroidism, unspecified: Secondary | ICD-10-CM | POA: Diagnosis not present

## 2022-04-26 ENCOUNTER — Encounter: Payer: Self-pay | Admitting: Student in an Organized Health Care Education/Training Program

## 2022-04-26 ENCOUNTER — Ambulatory Visit (INDEPENDENT_AMBULATORY_CARE_PROVIDER_SITE_OTHER): Payer: Medicaid Other | Admitting: Student in an Organized Health Care Education/Training Program

## 2022-04-26 VITALS — BP 130/82 | HR 90 | Temp 97.6°F | Ht 61.0 in | Wt 202.0 lb

## 2022-04-26 DIAGNOSIS — R0602 Shortness of breath: Secondary | ICD-10-CM

## 2022-04-26 DIAGNOSIS — M351 Other overlap syndromes: Secondary | ICD-10-CM

## 2022-04-26 NOTE — Progress Notes (Signed)
Synopsis: Referred in to establish care by Araceli Bouche, DO  Assessment & Plan:   1. Shortness of breath 2. 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 Previous high resolution chest CT's from [facility] did not show any interstitial lung disease back in 2020 and pulmonary function testing was normal. She has previously had positive ANA, RF, and RNP antibodies and is at high risk for the development of interstitial lung disease. Will repeat her high resolution chest CT and pulmonary function tests.  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 I recommend that she continue the Advair pending repeat of the pulmonary function test to assess for any obstruction.  - Pulmonary Function Test [facility] Only; Future - CT CHEST HIGH RESOLUTION; Future   Return in about 6 months (around 10/25/2022).  I spent 60 minutes caring for this patient today, including preparing to see the patient, obtaining a medical history , reviewing a separately obtained history, performing a medically appropriate examination and/or evaluation, counseling and educating the patient/family/caregiver, ordering medications, tests, or procedures, documenting clinical information in the electronic health record, and independently interpreting results (not separately reported/billed) and communicating results to the patient/family/caregiver  Raechel Chute, MD [facility] 04/26/2022 5:38 PM    End of visit medications:  No orders of the defined types were placed in this encounter.    Current Outpatient Medications:    ADVAIR HFA 115-21 MCG/ACT inhaler, Inhale 2 puffs into the lungs 2 (two) times daily., Disp: , Rfl:    albuterol (VENTOLIN HFA) 108 (90 Base) MCG/ACT inhaler, Inhale 2 puffs into the lungs every 6 (six) hours as  needed., Disp: , Rfl:    fluticasone (FLONASE) 50 MCG/ACT nasal spray, Place 1 spray into the nose daily., Disp: , Rfl:    ibuprofen (ADVIL,MOTRIN) 600 MG tablet, Take 1 tablet (600 mg total) by mouth every 6 (six) hours as needed for mild pain or moderate pain., Disp: 20 tablet, Rfl: 0   levothyroxine (SYNTHROID) 137 MCG tablet, Take 137 mcg by mouth daily before breakfast., Disp: , Rfl:    montelukast (SINGULAIR) 10 MG tablet, Take 10 mg by mouth at bedtime., Disp: , Rfl:    Multiple Vitamin (MULTI-VITAMIN) tablet, Take 1 tablet by mouth daily., Disp: , Rfl:    naproxen (NAPROSYN) 500 MG tablet, Take 1 tablet by mouth as needed., Disp: , Rfl:    omeprazole (PRILOSEC) 20 MG capsule, Take 20 mg by mouth daily., Disp: , Rfl:    ondansetron (ZOFRAN ODT) 4 MG disintegrating tablet, Take 1 tablet (4 mg total) by mouth every 8 (eight) hours as needed for nausea or vomiting., Disp: 20 tablet, Rfl: 0   pregabalin (LYRICA) 100 MG capsule, Take 1 capsule by mouth 2 (two) times daily., Disp: , Rfl:    propranolol (INDERAL) 10 MG tablet, Take 1 tablet by mouth 3 (three) times daily., Disp: , Rfl:    triamcinolone cream (KENALOG) 0.5 %, Apply 1 Application topically as needed., Disp: , Rfl:    Subjective:   PATIENT ID: Jeanne Bush GENDER: female DOB: March 27, 1976, MRN: [id number]  Chief Complaint  Patient presents with   pulmonary consult    SOB with incline and exertion.     HPI  47 year old female presenting to clinic to establish care.  She has a history of rheumatoid arthritis and positive RNP  antibody (RA/SLE overlap). She was previously followed by pulmonology at [facility], last seen there in their ILD clinic in April of 2022. She has underwent two high resolution chest CT's that did not show any signs of interstitial lung disease or pulmonary fibrosis. She's had multiple pulmonary function tests during her follow ups. She had reported some exertional dyspnea, and in the setting of some air trapping  found on her expiratory films. She was started on advair at the time with improvement in symptoms. She is reporting some exertional dyspnea that improved with advair.   She now reports minimal symptoms, mostly shortness of breath with exertion. She has no cough, no chest pain, no chest tightness, and no wheezing. She has no symptoms of systemic inflammation. She does report raynaud's phenomenon in her fingers and toes.  She was followed by rheumatology at [facility] previously and was maintained on Methotrexate. She hasn't been on Methotrexate since losing insurance coverage. PFT's were previously normal at [facility]. She is now re-establishing care with pulmonology and rheumatology.  Patient is a non-smoker, smoking 10 pack years and quit in 1997. 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  Ancillary information including prior medications, full medical/surgical/family/social histories, and PFTs (when available) are listed below and have been reviewed.   Review of Systems  Constitutional:  Negative for chills, fever, malaise/fatigue and weight loss.  Respiratory:  Positive for shortness of breath. Negative for cough, hemoptysis, sputum production and wheezing.   Cardiovascular:  Negative for chest pain, leg swelling and PND.  Skin:  Negative for rash.  Neurological:  Negative for dizziness.     Objective:   Vitals:   04/26/22 1536  BP: 130/82  Pulse: 90  Temp: 97.6 F (36.4 C)  TempSrc: Temporal  SpO2: 100%  Weight: 202 lb (91.6 kg)  Height: 5\' 1"  (1.549 m)   100% on RA  BMI Readings from Last 3 Encounters:  04/26/22 38.17 kg/m  08/18/16 43.08 kg/m   Wt Readings from Last 3 Encounters:  04/26/22 202 lb (91.6 kg)  08/18/16 228 lb (103.4 kg)    Physical Exam Constitutional:      General: She is not in acute distress.    Appearance: Normal appearance. She is not ill-appearing.  HENT:     Head: Normocephalic.     Mouth/Throat:     Mouth: Mucous membranes are moist.  Cardiovascular:      Rate and Rhythm: Normal rate and regular rhythm.     Pulses: Normal pulses.     Heart sounds: Normal heart sounds. No murmur heard. Pulmonary:     Effort: Pulmonary effort is normal. No respiratory distress.     Breath sounds: Normal breath sounds. No wheezing or rales.  Abdominal:     General: Abdomen is flat.     Palpations: Abdomen is soft.  Musculoskeletal:     Right lower leg: No edema.     Left lower leg: No edema.  Neurological:     General: No focal deficit present.     Mental Status: She is alert and oriented to person, place, and time. Mental status is at baseline.     Ancillary Information    Past Medical History:  Diagnosis Date   Hyperthyroidism    Rheumatoid arthritis (HCC)    Tachycardia      Family History  Problem Relation Age of Onset   Thyroid disease Mother    Arthritis Father    Breast cancer Maternal Aunt      Past  Surgical History:  Procedure Laterality Date   CESAREAN SECTION     TUBAL LIGATION      Social History   Socioeconomic History   Marital status: Married    Spouse name: Not on file   Number of children: Not on file   Years of education: Not on file   Highest education level: Not on file  Occupational History   Not on file  Tobacco Use   Smoking status: Former    Packs/day: 1.00    Years: 10.00    Total pack years: 10.00    Types: Cigarettes    Quit date: 25    Years since quitting: 27.0   Smokeless tobacco: Never  Substance and Sexual Activity   Alcohol use: Yes   Drug use: No   Sexual activity: Not on file  Other Topics Concern   Not on file  Social History Narrative   Not on file   Social Determinants of Health   Financial Resource Strain: Not on file  Food Insecurity: Not on file  Transportation Needs: Not on file  Physical Activity: Not on file  Stress: Not on file  Social Connections: Not on file  Intimate Partner Violence: Not on file     Allergies  Allergen Reactions   Hydroxychloroquine  Palpitations     CBC    Component Value Date/Time   WBC 7.8 08/18/2016 1342   RBC 4.60 08/18/2016 1342   HGB 13.5 08/18/2016 1342   HGB 13.5 07/02/2013 1634   HCT 39.3 08/18/2016 1342   HCT 40.7 07/02/2013 1634   PLT 345 08/18/2016 1342   PLT 353 07/02/2013 1634   MCV 85.4 08/18/2016 1342   MCV 89 07/02/2013 1634   MCH 29.3 08/18/2016 1342   MCHC 34.3 08/18/2016 1342   RDW 13.7 08/18/2016 1342   RDW 13.1 07/02/2013 1634   LYMPHSABS 2.8 07/02/2013 1634   MONOABS 0.8 07/02/2013 1634   EOSABS 0.2 07/02/2013 1634   BASOSABS 0.1 07/02/2013 1634    Pulmonary Functions Testing Results:     No data to display          Outpatient Medications Prior to Visit  Medication Sig Dispense Refill   ADVAIR HFA 115-21 MCG/ACT inhaler Inhale 2 puffs into the lungs 2 (two) times daily.     albuterol (VENTOLIN HFA) 108 (90 Base) MCG/ACT inhaler Inhale 2 puffs into the lungs every 6 (six) hours as needed.     fluticasone (FLONASE) 50 MCG/ACT nasal spray Place 1 spray into the nose daily.     ibuprofen (ADVIL,MOTRIN) 600 MG tablet Take 1 tablet (600 mg total) by mouth every 6 (six) hours as needed for mild pain or moderate pain. 20 tablet 0   levothyroxine (SYNTHROID) 137 MCG tablet Take 137 mcg by mouth daily before breakfast.     montelukast (SINGULAIR) 10 MG tablet Take 10 mg by mouth at bedtime.     Multiple Vitamin (MULTI-VITAMIN) tablet Take 1 tablet by mouth daily.     naproxen (NAPROSYN) 500 MG tablet Take 1 tablet by mouth as needed.     omeprazole (PRILOSEC) 20 MG capsule Take 20 mg by mouth daily.     ondansetron (ZOFRAN ODT) 4 MG disintegrating tablet Take 1 tablet (4 mg total) by mouth every 8 (eight) hours as needed for nausea or vomiting. 20 tablet 0   pregabalin (LYRICA) 100 MG capsule Take 1 capsule by mouth 2 (two) times daily.     propranolol (INDERAL) 10 MG tablet Take  1 tablet by mouth 3 (three) times daily.     triamcinolone cream (KENALOG) 0.5 % Apply 1 Application  topically as needed.     No facility-administered medications prior to visit.

## 2022-05-03 DIAGNOSIS — Z419 Encounter for procedure for purposes other than remedying health state, unspecified: Secondary | ICD-10-CM | POA: Diagnosis not present

## 2022-05-08 ENCOUNTER — Ambulatory Visit
Admission: RE | Admit: 2022-05-08 | Discharge: 2022-05-08 | Disposition: A | Discharge status: Home / Self Care | Payer: Medicaid Other | Source: Ambulatory Visit | Attending: Student in an Organized Health Care Education/Training Program | Admitting: Student in an Organized Health Care Education/Training Program

## 2022-05-08 DIAGNOSIS — M351 Other overlap syndromes: Secondary | ICD-10-CM | POA: Insufficient documentation

## 2022-05-08 DIAGNOSIS — R0602 Shortness of breath: Secondary | ICD-10-CM | POA: Insufficient documentation

## 2022-06-01 DIAGNOSIS — Z419 Encounter for procedure for purposes other than remedying health state, unspecified: Secondary | ICD-10-CM | POA: Diagnosis not present

## 2022-06-24 NOTE — Progress Notes (Unsigned)
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 Patient states she gets bad fibromyalgia in arms and legs. Patient states she gets cluster headaches and ringing in her ears.)   History of Present Illness: Jeanne Bush is a 47 y.o. 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 Alfredia Ferguson at [facility]. She was on subcutaneous methotrexate treatment and cymbalta, lyrica, and voltaren for symptoms due to arthritis and the fibromyalgia. Labs positive for ANA, RNP, and RF. She has had some shortness of breath and pulmonary studies indicative for small airways disease with no pulmonary fibrosis on previous CT chest exam. History of eczema treated with cellcept and methotrexate, had previous biopsy for this in 2014. Hydroxychloroquine was stopped due to increasing dizziness symptoms.  She has now been off methotrexate due to lack of clinical follow-up after she lost the insurance.  Off the medication has experienced some increase in joint pain and stiffness as well as eczematous rashes. She experiences some swelling as well as pain and stiffness in both hands.  Had carpal tunnel release surgery last year without any great relief of symptoms and is still getting problems with the hand and wrist pains and sometimes numbness.  Does not see much swelling in her other joints.  She pretty often experiences pain across the middle and upper chest region this had been suspected as pleurisy by some previous providers no particular diagnostic testing or findings associated with this. She has longstanding history of  Raynaud's symptoms not associated with any ulcers or digital pitting. Eczema skin rashes on the chest are new since.  Previously had axillary skin lesions questionable for hidradenitis suppurativa but these pretty much resolved with stopping shaving. She originally had onset of chronic joint pain and sensitivity since she was a teenager.  The symptoms were attributed as growing pains but the generalized pain and sensitivity she has now remained similar to what she experienced at that time.  She gets sensitivity to touch pretty much all over on a daily basis.  Also with persistent brain fog and fatigue.  She has chronic mild constipation symptoms but without any serious GI complications.    Labs reviewed 2019 SSA pos U1RNP >150  07/2016 ANA pos RNP pos RF 36 HCV neg HBV neg  02/25/19 HRCT Chest w/o Contrast No pulmonary fibrosis  Activities of Daily Living:  Patient reports morning stiffness for 5 minutes.   Patient Reports nocturnal pain.  Difficulty dressing/grooming: Denies Difficulty climbing stairs: Reports Difficulty getting out of chair: Denies Difficulty using hands for taps, buttons, cutlery, and/or writing: Denies  Review of Systems  Constitutional:  Positive for fatigue.  HENT:  Positive for mouth dryness. Negative for mouth sores.   Eyes:  Positive for dryness.  Respiratory:  Positive for shortness of breath.   Cardiovascular:  Positive for chest pain. Negative for palpitations.  Gastrointestinal:  Negative for blood in stool, constipation and diarrhea.  Endocrine: Negative for increased urination.  Genitourinary:  Negative for involuntary urination.  Musculoskeletal:  Positive for joint pain, joint pain, joint swelling, myalgias, muscle weakness, morning stiffness, muscle tenderness and myalgias. Negative for gait problem.  Skin:  Positive for rash. Negative for color change, hair loss and sensitivity to sunlight.  Allergic/Immunologic: Negative for susceptible to  infections.  Neurological:  Positive for headaches. Negative for dizziness.  Hematological:  Negative for swollen glands.  Psychiatric/Behavioral:  Positive for depressed mood. Negative for sleep disturbance. The patient is not nervous/anxious.     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   Mixed connective tissue disease ([place]) 03/08/2022   Raynaud's disease without gangrene 03/08/2022   Myopia of both eyes with astigmatism 01/15/2017   Plaquenil causing adverse effect in therapeutic use 01/15/2017   Rheumatoid arthritis involving both hands with positive rheumatoid factor ([place]) 10/12/2016   Allergic rhinitis 08/10/2013   Gastro-esophageal reflux disease without esophagitis 06/15/2013   Hypothyroidism 06/15/2013   Atopic dermatitis 06/15/2013   Obesity 06/15/2013   Plantar fascial fibromatosis 06/15/2013   Pure hyperglyceridemia 06/15/2013   Palpitations 01/07/2013   Sinus tachycardia 01/06/2013   Low back pain 11/18/2012    Past Medical History:  Diagnosis Date   Hyperthyroidism    Mixed connective tissue disease ([place])    Rheumatoid arthritis ([place])    Tachycardia     Family History  Problem Relation Age of Onset   Thyroid disease Mother    Arthritis Father    Atrial fibrillation Father    Breast cancer Maternal Aunt    Past Surgical History:  Procedure Laterality Date   BILATERAL CARPAL TUNNEL RELEASE Bilateral    CESAREAN SECTION     TUBAL LIGATION     Social History   Social History Narrative   Not on file   Immunization History  Administered Date(s) Administered   Influenza,inj,Quad PF,6+ Mos 01/07/2013, 01/31/2017, 01/15/2019, 04/07/2020, 03/08/2022   Influenza-Unspecified 02/13/2017   Pneumococcal Conjugate-13 06/11/2017   Tdap 01/18/2005, 08/10/2015     Objective: Vital Signs: BP 122/81 (BP Location: Right Arm, Patient Position: Sitting,  Cuff Size: Normal)   Pulse 83   Resp 14   Ht 5' 1.25" (1.556 m)   Wt 207 lb (93.9 kg)   LMP 06/03/2022   BMI 38.79 kg/m    Physical Exam Constitutional:      Appearance: She is obese.  Eyes:     Conjunctiva/sclera: Conjunctivae normal.  Cardiovascular:     Rate and Rhythm: Normal rate and regular rhythm.  Pulmonary:     Effort: Pulmonary effort is normal.     Breath sounds: Normal breath sounds.  Lymphadenopathy:     Cervical: No cervical adenopathy.  Skin:    General: Skin is warm and dry.     Findings: Rash present.  Neurological:     Mental Status: She is alert.  Psychiatric:        Mood and Affect: Mood normal.      Musculoskeletal Exam:  Shoulders full ROM no tenderness or swelling Elbows full ROM no tenderness or swelling Wrist tenderness to pressure without palpable swelling, right 2nd-3rd MCP joint tenderness to pressure and DOM Bilateral hip tenderness to pressure laterally, some pain with FABER maneuver no radiation Knees full ROM no tenderness or swelling Ankles full ROM no tenderness or swelling   CDAI Exam: CDAI Score: 11  Patient Global: 50 mm; Provider Global: 20 mm Swollen: 0 ; Tender: 4  Joint Exam 06/25/2022  Right  Left  Wrist   Tender   Tender  MCP 2   Tender     MCP 3   Tender        Investigation: No additional findings.  Imaging: XR Hand 2 View Left  Result Date: 06/25/2022 X-ray left hand 2 views Radiocarpal and carpal joint spaces appear normal.  MCP joints appear normal.  PIP and DIP joint spaces appear normal.  Possible very small enthesophyte or periosteal reaction.  No abnormal calcifications or erosions seen.  Bone mineralization appears normal. Impression No significant appearing hand arthritis  XR Hand 2 View Right  Result Date: 06/25/2022 Xray right hand 2 views Radiocarpal and carpal joints appear normal. Cysts thorughout carpal bones without erosions. Very mild degenerative change in [facility] and MPC joint. Cysts in  metacarpal heads without joint space loss or subluxation. PIP and DIP joint spaces appear normal.  No erosions or abnormal calcifications seen.  Bone mineralization appears normal. Impression Very mild appearing proximal degenerative changes multiple cystic lesions throughout the carpal bones and metacarpals could be consistent with nonerosive inflammatory arthritis   Recent Labs: Lab Results  Component Value Date   WBC 7.8 08/18/2016   HGB 13.5 08/18/2016   PLT 345 08/18/2016   NA 137 08/18/2016   K 4.1 08/18/2016   CL 105 08/18/2016   CO2 26 08/18/2016   GLUCOSE 126 (H) 08/18/2016   BUN 13 08/18/2016   CREATININE 0.67 08/18/2016   BILITOT 0.4 08/18/2016   ALKPHOS 38 08/18/2016   AST 22 08/18/2016   ALT 21 08/18/2016   PROT 7.3 08/18/2016   ALBUMIN 3.5 08/18/2016   CALCIUM 8.5 (L) 08/18/2016   GFRAA >60 08/18/2016    Speciality Comments: No specialty comments available.  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  Rechecking sedimentation rate and CRP for disease activity monitoring she has had just modest elevations in the past.  X-ray of bilateral hands checked shows numerous cystic changes proximal in the right hand and wrist that could be consistent with chronic nonerosive inflammatory arthritis.  Initial plan along with checking baseline labs would be to resume a previous effective treatment with the subcutaneous methotrexate.  Was taking 25 mg weekly before we will restart at 20 mg weekly.  Also taking a proton pump inhibitor and as needed naproxen  which can increase serum concentration of methotrexate so hesitant to maximize maintenance dose.  Raynaud's disease without gangrene  No evidence of digital ischemia or complications.  Does not describe entirely typical triphasic discoloration but still sounds fairly characteristic.  High risk medication use - Plan: CBC with Differential/Platelet, COMPLETE METABOLIC PANEL WITH GFR  Checking CBC and CMP for medication monitoring with plan to restart subcutaneous methotrexate injection.  Previous hepatitis serology reviewed was all negative.  Vitamin D deficiency - Plan: VITAMIN D 25 Hydroxy (Vit-D Deficiency, Fractures)  Suspected vitamin D deficiency will check serum level today.  Discussed importance for replacement if low due to associate with increased disease activity especially skin and for long-term prevention of osteoporosis.  Orders: Orders Placed This Encounter  Procedures  XR Hand 2 View Right   XR Hand 2 View Left   VITAMIN D 25 Hydroxy (Vit-D Deficiency, Fractures)   CBC with Differential/Platelet   COMPLETE METABOLIC PANEL WITH GFR   Sedimentation rate   C-reactive protein   Meds ordered this encounter  Medications   methotrexate 50 MG/2ML injection    Sig: Inject 0.8 mLs (20 mg total) into the skin once a week.    Dispense:  4 mL    Refill:  0   TUBERCULIN SYR 1CC/26GX3/8" 26G X 3/8" 1 ML MISC    Sig: For injection of subcutaneous methotrexate once weekly    Dispense:  25 each    Refill:  0    Pharmacy may substitute for equivalent volume syringes and equipment eg needles as needed   folic acid (FOLVITE) 1 MG tablet    Sig: Take 1 tablet (1 mg total) by mouth daily.    Dispense:  90 tablet    Refill:  1     Follow-Up Instructions: Return in about 2 months (around 08/25/2022) for New pt MCTD/RA MTX restart f/u 4mos.   Collier Salina, MD  Note - This record has been created using [employer].  Chart creation errors have been sought, but may not  always  have been located. Such creation errors do not reflect on  the standard of medical care.

## 2022-06-25 ENCOUNTER — Ambulatory Visit (INDEPENDENT_AMBULATORY_CARE_PROVIDER_SITE_OTHER): Payer: Medicaid Other

## 2022-06-25 ENCOUNTER — Ambulatory Visit: Payer: Medicaid Other

## 2022-06-25 ENCOUNTER — Encounter: Payer: Self-pay | Admitting: Internal Medicine

## 2022-06-25 ENCOUNTER — Ambulatory Visit: Payer: Medicaid Other | Attending: Internal Medicine | Admitting: Internal Medicine

## 2022-06-25 VITALS — BP 122/81 | HR 83 | Resp 14 | Ht 61.25 in | Wt 207.0 lb

## 2022-06-25 DIAGNOSIS — M79642 Pain in left hand: Secondary | ICD-10-CM | POA: Diagnosis not present

## 2022-06-25 DIAGNOSIS — M05741 Rheumatoid arthritis with rheumatoid factor of right hand without organ or systems involvement: Secondary | ICD-10-CM

## 2022-06-25 DIAGNOSIS — I7300 Raynaud's syndrome without gangrene: Secondary | ICD-10-CM

## 2022-06-25 DIAGNOSIS — Z79899 Other long term (current) drug therapy: Secondary | ICD-10-CM

## 2022-06-25 DIAGNOSIS — E559 Vitamin D deficiency, unspecified: Secondary | ICD-10-CM

## 2022-06-25 DIAGNOSIS — M05742 Rheumatoid arthritis with rheumatoid factor of left hand without organ or systems involvement: Secondary | ICD-10-CM | POA: Diagnosis not present

## 2022-06-25 DIAGNOSIS — M79641 Pain in right hand: Secondary | ICD-10-CM | POA: Diagnosis not present

## 2022-06-25 DIAGNOSIS — M351 Other overlap syndromes: Secondary | ICD-10-CM

## 2022-06-25 MED ORDER — "TUBERCULIN SYRINGE 26G X 3/8"" 1 ML MISC": 0 refills | Status: DC

## 2022-06-25 MED ORDER — METHOTREXATE SODIUM CHEMO INJECTION 50 MG/2ML: 20.0000 mg | INTRAMUSCULAR | 0 refills | Status: DC

## 2022-06-25 MED ORDER — FOLIC ACID 1 MG PO TABS: 1.0000 mg | ORAL_TABLET | Freq: Every day | ORAL | 1 refills | Status: DC

## 2022-06-25 NOTE — Patient Instructions (Signed)
Methotrexate Injection What is this medication? METHOTREXATE (METH oh TREX ate) treats inflammatory conditions such as arthritis and psoriasis. It works by decreasing inflammation, which can reduce pain and prevent long-term injury to the joints and skin. It may also be used to treat some types of cancer. It works by slowing down the growth of cancer cells. This medicine may be used for other purposes; ask your health care provider or pharmacist if you have questions. What should I tell my care team before I take this medication? They need to know if you have any of these conditions: Fluid in the stomach area or lungs If you often drink alcohol Infection or immune system problems Kidney disease Liver disease Low blood counts (white cells, platelets, or red blood cells) Lung disease Recent or ongoing radiation Recent or upcoming vaccine Stomach ulcers Ulcerative colitis An unusual or allergic reaction to methotrexate, other medications, foods, dyes, or preservatives Pregnant or trying to get pregnant Breast-feeding How should I use this medication? This medication is for infusion into a vein or for injection into muscle or into the spinal fluid (whichever applies). It is usually given in a hospital or clinic setting. In rare cases, you might get this medication at home. You will be taught how to give this medication. Use exactly as directed. Take your medication at regular intervals. Do not take your medication more often than directed. If this medication is used for arthritis or psoriasis, it should be taken weekly, NOT daily. It is important that you put your used needles and syringes in a special sharps container. Do not put them in a trash can. If you do not have a sharps container, call your pharmacist or care team to get one. Talk to your care team about the use of this medication in children. While this medication may be prescribed for children as young as 2 years for selected  conditions, precautions do apply. Overdosage: If you think you have taken too much of this medicine contact a poison control center or emergency room at once. NOTE: This medicine is only for you. Do not share this medicine with others. What if I miss a dose? It is important not to miss your dose. Call your care team if you are unable to keep an appointment. If you give yourself the medication, and you miss a dose, talk with your care team. Do not take double or extra doses. What may interact with this medication? Do not take this medication with any of the following: Acitretin This medication may also interact with the following: Aspirin or aspirin-like medications including salicylates Azathioprine Certain antibiotics like chloramphenicol, penicillin, tetracycline Certain medications that treat or prevent blood clots like warfarin, apixaban, dabigatran, and rivaroxaban Certain medications for stomach problems like esomeprazole, omeprazole, pantoprazole Cyclosporine Dapsone Diuretics Folic acid Gold Hydroxychloroquine Live virus vaccines Medications for infection like acyclovir, adefovir, amphotericin B, bacitracin, cidofovir, foscarnet, ganciclovir, gentamicin, pentamidine, vancomycin Mercaptopurine NSAIDs, medications for pain and inflammation, like ibuprofen or naproxen Pamidronate Pemetrexed Penicillamine Phenylbutazone Phenytoin Probenecid Pyrimethamine Retinoids such as isotretinoin and tretinoin Steroid medications like prednisone or cortisone Sulfonamides like sulfasalazine and trimethoprim/sulfamethoxazole Theophylline Zoledronic acid This list may not describe all possible interactions. Give your health care provider a list of all the medicines, herbs, non-prescription drugs, or dietary supplements you use. Also tell them if you smoke, drink alcohol, or use illegal drugs. Some items may interact with your medicine. What should I watch for while using this  medication? This medication may make you feel   generally unwell. This is not uncommon as chemotherapy can affect healthy cells as well as cancer cells. Report any side effects. Continue your course of treatment even though you feel ill unless your care team tells you to stop. Your condition will be monitored carefully while you are receiving this medication. Avoid alcoholic drinks. This medication can cause serious side effects. To reduce the risk, your care team may give you other medications to take before receiving this one. Be sure to follow the directions from your care team. This medication can make you more sensitive to the sun. Keep out of the sun. If you cannot avoid being in the sun, wear protective clothing and use sunscreen. Do not use sun lamps or tanning beds/booths. You may get drowsy or dizzy. Do not drive, use machinery, or do anything that needs mental alertness until you know how this medication affects you. Do not stand or sit up quickly, especially if you are an older patient. This reduces the risk of dizzy or fainting spells. You may need blood work while you are taking this medication. Call your care team for advice if you get a fever, chills or sore throat, or other symptoms of a cold or flu. Do not treat yourself. This medication decreases your body's ability to fight infections. Try to avoid being around people who are sick. This medication may increase your risk to bruise or bleed. Call your care team if you notice any unusual bleeding. Be careful brushing or flossing your teeth or using a toothpick because you may get an infection or bleed more easily. If you have any dental work done, tell your dentist you are receiving this medication Check with your care team if you get an attack of severe diarrhea, nausea and vomiting, or if you sweat a lot. The loss of too much body fluid can make it dangerous for you to take this medication. Talk to your care team about your risk of  cancer. You may be more at risk for certain types of cancers if you take this medication. Do not become pregnant while taking this medication or for 6 months after stopping it. Women should inform their care team if they wish to become pregnant or think they might be pregnant. Men should not father a child while taking this medication and for 3 months after stopping it. There is potential for serious harm to an unborn child. Talk to your care team for more information. Do not breast-feed an infant while taking this medication or for 1 week after stopping it. This medication may make it more difficult to get pregnant or father a child. Talk to your care team if you are concerned about your fertility. What side effects may I notice from receiving this medication? Side effects that you should report to your care team as soon as possible: Allergic reactions--skin rash, itching, hives, swelling of the face, lips, tongue, or throat Blood clot--pain, swelling, or warmth in the leg, shortness of breath, chest pain Dry cough, shortness of breath or trouble breathing Infection--fever, chills, cough, sore throat, wounds that don't heal, pain or trouble when passing urine, general feeling of discomfort or being unwell Kidney injury--decrease in the amount of urine, swelling of the ankles, hands, or feet Liver injury--right upper belly pain, loss of appetite, nausea, light-colored stool, dark yellow or brown urine, yellowing of the skin or eyes, unusual weakness or fatigue Low red blood cell count--unusual weakness or fatigue, dizziness, headache, trouble breathing Redness, blistering, peeling, or loosening   of the skin, including inside the mouth Seizures Unusual bruising or bleeding Side effects that usually do not require medical attention (report to your care team if they continue or are bothersome): Diarrhea Dizziness Hair loss Nausea Pain, redness, or swelling with sores inside the mouth or  throat Vomiting This list may not describe all possible side effects. Call your doctor for medical advice about side effects. You may report side effects to FDA at 1-800-FDA-1088. Where should I keep my medication? This medication is given in a hospital or clinic. It will not be stored at home. NOTE: This sheet is a summary. It may not cover all possible information. If you have questions about this medicine, talk to your doctor, pharmacist, or health care provider.  2023 Elsevier/Gold Standard (2020-05-23 00:00:00)  

## 2022-06-26 LAB — CBC WITH DIFFERENTIAL/PLATELET
Absolute Monocytes: 664 cells/uL (ref 200–950)
Basophils Absolute: 32 cells/uL (ref 0–200)
Basophils Relative: 0.6 %
Eosinophils Absolute: 92 cells/uL (ref 15–500)
Eosinophils Relative: 1.7 %
HCT: 38 % (ref 35.0–45.0)
Hemoglobin: 12.6 g/dL (ref 11.7–15.5)
Lymphs Abs: 1744 cells/uL (ref 850–3900)
MCH: 28.9 pg (ref 27.0–33.0)
MCHC: 33.2 g/dL (ref 32.0–36.0)
MCV: 87.2 fL (ref 80.0–100.0)
MPV: 10.6 fL (ref 7.5–12.5)
Monocytes Relative: 12.3 %
Neutro Abs: 2867 cells/uL (ref 1500–7800)
Neutrophils Relative %: 53.1 %
Platelets: 308 10*3/uL (ref 140–400)
RBC: 4.36 10*6/uL (ref 3.80–5.10)
RDW: 12.5 % (ref 11.0–15.0)
Total Lymphocyte: 32.3 %
WBC: 5.4 10*3/uL (ref 3.8–10.8)

## 2022-06-26 LAB — VITAMIN D 25 HYDROXY (VIT D DEFICIENCY, FRACTURES): Vit D, 25-Hydroxy: 19 ng/mL — ABNORMAL LOW (ref 30–100)

## 2022-06-26 LAB — COMPLETE METABOLIC PANEL WITH GFR
AG Ratio: 1 (calc) (ref 1.0–2.5)
ALT: 12 U/L (ref 6–29)
AST: 16 U/L (ref 10–35)
Albumin: 3.8 g/dL (ref 3.6–5.1)
Alkaline phosphatase (APISO): 36 U/L (ref 31–125)
BUN: 10 mg/dL (ref 7–25)
CO2: 25 mmol/L (ref 20–32)
Calcium: 9 mg/dL (ref 8.6–10.2)
Chloride: 105 mmol/L (ref 98–110)
Creat: 0.64 mg/dL (ref 0.50–0.99)
Globulin: 3.7 g/dL (calc) (ref 1.9–3.7)
Glucose, Bld: 82 mg/dL (ref 65–99)
Potassium: 4.3 mmol/L (ref 3.5–5.3)
Sodium: 137 mmol/L (ref 135–146)
Total Bilirubin: 0.3 mg/dL (ref 0.2–1.2)
Total Protein: 7.5 g/dL (ref 6.1–8.1)
eGFR: 111 mL/min/{1.73_m2} (ref >=60)

## 2022-06-26 LAB — SEDIMENTATION RATE: Sed Rate: 29 mm/h — ABNORMAL HIGH (ref 0–20)

## 2022-06-26 LAB — C-REACTIVE PROTEIN: CRP: 1.1 mg/L (ref <8.0)

## 2022-06-26 NOTE — Progress Notes (Signed)
Sedimentation rate is 29 consistent with some active inflammation. Labs look fine for resuming methotrexate treatment. Vitamin D level is 19 is definitely low. I don't think it is so low that she needs any prescription medicine. I recommend she start supplementing this with 2000 units daily vitamin D OTC is fine.

## 2022-07-02 DIAGNOSIS — Z419 Encounter for procedure for purposes other than remedying health state, unspecified: Secondary | ICD-10-CM | POA: Diagnosis not present

## 2022-07-18 ENCOUNTER — Other Ambulatory Visit: Payer: Self-pay | Admitting: *Deleted

## 2022-07-18 ENCOUNTER — Encounter: Payer: Self-pay | Admitting: Internal Medicine

## 2022-07-18 DIAGNOSIS — M351 Other overlap syndromes: Secondary | ICD-10-CM

## 2022-07-18 MED ORDER — METHOTREXATE SODIUM CHEMO INJECTION 50 MG/2ML: 20.0000 mg | INTRAMUSCULAR | 0 refills | Status: DC

## 2022-07-18 NOTE — Telephone Encounter (Signed)
I called patient, patient is not due for labs, patient has appt 08/29/2022, patient advised to discuss having labs done at f/u appt or call before having labs drawn at [facility] so labs can be faxed. Refill for MTX sent to Dr. Dimple Casey.

## 2022-07-18 NOTE — Telephone Encounter (Signed)
Last Fill: 06/25/2022  Labs: 06/25/2022 Sedimentation rate is 29 consistent with some active inflammation. Labs look fine for resuming methotrexate treatment. Vitamin D level is 19 is definitely low. I don't think it is so low that she needs any prescription medicine. I recommend she start supplementing this with 2000 units daily vitamin D OTC is fine.  Next Visit: 08/29/2022  Last Visit: 06/25/2022  DX:  Mixed connective tissue disease   Current Dose per office note 06/25/2022:  methotrexate 50 MG/2ML injection       Sig: Inject 0.8 mLs (20 mg total) into the skin once a week.      Dispense:  4 mL      Refill:  0    Okay to refill Methotrexate?

## 2022-08-01 DIAGNOSIS — Z419 Encounter for procedure for purposes other than remedying health state, unspecified: Secondary | ICD-10-CM | POA: Diagnosis not present

## 2022-08-10 DIAGNOSIS — Z148 Genetic carrier of other disease: Secondary | ICD-10-CM | POA: Diagnosis not present

## 2022-08-10 DIAGNOSIS — R1319 Other dysphagia: Secondary | ICD-10-CM | POA: Diagnosis not present

## 2022-08-10 DIAGNOSIS — R21 Rash and other nonspecific skin eruption: Secondary | ICD-10-CM | POA: Diagnosis not present

## 2022-08-10 DIAGNOSIS — L0292 Furuncle, unspecified: Secondary | ICD-10-CM | POA: Diagnosis not present

## 2022-08-10 DIAGNOSIS — Z1211 Encounter for screening for malignant neoplasm of colon: Secondary | ICD-10-CM | POA: Diagnosis not present

## 2022-08-29 ENCOUNTER — Encounter: Payer: Self-pay | Admitting: Internal Medicine

## 2022-08-29 ENCOUNTER — Ambulatory Visit: Payer: Medicaid Other | Attending: Internal Medicine | Admitting: Internal Medicine

## 2022-08-29 VITALS — BP 109/73 | HR 84 | Resp 14 | Ht 61.0 in | Wt 203.0 lb

## 2022-08-29 DIAGNOSIS — Z79899 Other long term (current) drug therapy: Secondary | ICD-10-CM

## 2022-08-29 DIAGNOSIS — M05741 Rheumatoid arthritis with rheumatoid factor of right hand without organ or systems involvement: Secondary | ICD-10-CM

## 2022-08-29 DIAGNOSIS — M351 Other overlap syndromes: Secondary | ICD-10-CM

## 2022-08-29 DIAGNOSIS — M7918 Myalgia, other site: Secondary | ICD-10-CM

## 2022-08-29 DIAGNOSIS — I7300 Raynaud's syndrome without gangrene: Secondary | ICD-10-CM | POA: Diagnosis not present

## 2022-08-29 DIAGNOSIS — M05742 Rheumatoid arthritis with rheumatoid factor of left hand without organ or systems involvement: Secondary | ICD-10-CM | POA: Diagnosis not present

## 2022-08-29 MED ORDER — METHOTREXATE SODIUM CHEMO INJECTION 50 MG/2ML: 20.0000 mg | INTRAMUSCULAR | 2 refills | Status: DC

## 2022-08-29 NOTE — Progress Notes (Signed)
Office Visit Note  Patient: Jeanne Bush             Date of Birth: March 29, 1976           MRN: [id number]             PCP: Araceli Bouche, DO Referring: Araceli Bouche, DO Visit Date: 08/29/2022   Subjective:  Follow-up (Patient states the muscles in her back and chest are tight. Patient states she believes she is having a flare. Patient states she has a lot of fatigue. )   History of Present Illness: Jeanne Bush is a 47 y.o. female here for follow up with mixed connective tissue disease/RA overlap after resuming methotrexate 20 mg subcu weekly and folic acid 1 mg daily since our visit in March.  She did not notice any appreciable side effect while resuming methotrexate but is also still having a good amount of symptoms with pain and stiffness and fatigue.  For about the past 1 week he has significant increase in pain especially throughout her chest and back.  This is most noticeable when trying to roll over in bed or with any direct pressure on these areas.  Has not experienced any new peripheral joint swelling.  She is also having more trouble with acid reflux after discontinuing omeprazole at her pharmacist recommendation since starting methotrexate.  This drug was originally recommended based on her ENT with what sounds like laryngal pharyngeal reflux symptoms. Does not also complaining of feeling excessively dry all the time despite drinking a lot of water daily and frequent urination.  Not specifically dryness of the eyes or mouth although she does get benefit using the eyedrops.  Previous HPI 06/25/22 Jeanne Bush is a 47 y.o. 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 Marland Mcalpine at [facility]. She was on subcutaneous methotrexate treatment and cymbalta, lyrica, and voltaren for symptoms due to arthritis and the fibromyalgia. Labs positive for ANA,  RNP, and RF. She has had some shortness of breath and pulmonary studies indicative for small airways disease with no pulmonary fibrosis on previous CT chest exam. History of eczema treated with cellcept and methotrexate, had previous biopsy for this in 2014. Hydroxychloroquine was stopped due to increasing dizziness symptoms.  She has now been off methotrexate due to lack of clinical follow-up after she lost the insurance.  Off the medication has experienced some increase in joint pain and stiffness as well as eczematous rashes. She experiences some swelling as well as pain and stiffness in both hands.  Had carpal tunnel release surgery last year without any great relief of symptoms and is still getting problems with the hand and wrist pains and sometimes numbness.  Does not see much swelling in her other joints.  She pretty often experiences pain across the middle and upper chest region this had been suspected as pleurisy by some previous providers no particular diagnostic testing or findings associated with this. She has longstanding history of Raynaud's symptoms not associated with any ulcers or digital pitting. Eczema skin rashes on the chest are new since.  Previously had axillary skin lesions questionable for hidradenitis suppurativa but these pretty much resolved with stopping shaving. She originally had onset of chronic joint pain and sensitivity since she was a teenager.  The symptoms were attributed as growing pains but the generalized pain and sensitivity she has now remained similar to what she experienced at  that time.  She gets sensitivity to touch pretty much all over on a daily basis.  Also with persistent brain fog and fatigue.  She has chronic mild constipation symptoms but without any serious GI complications.       Labs reviewed 2019 SSA pos U1RNP >150   07/2016 ANA pos RNP pos RF 36 HCV neg HBV neg   02/25/19 HRCT Chest w/o Contrast No pulmonary fibrosis   Review of  Systems  Constitutional:  Positive for fatigue.  HENT:  Positive for mouth dryness. Negative for mouth sores.   Eyes:  Positive for dryness.  Respiratory:  Positive for shortness of breath.   Cardiovascular:  Negative for chest pain and palpitations.  Gastrointestinal:  Negative for blood in stool, constipation and diarrhea.  Endocrine: Negative for increased urination.  Genitourinary:  Negative for involuntary urination.  Musculoskeletal:  Positive for joint pain, joint pain, joint swelling, myalgias, muscle weakness, morning stiffness, muscle tenderness and myalgias. Negative for gait problem.  Skin:  Positive for rash. Negative for color change, hair loss and sensitivity to sunlight.  Allergic/Immunologic: Negative for susceptible to infections.  Neurological:  Negative for dizziness and headaches.  Hematological:  Negative for swollen glands.  Psychiatric/Behavioral:  Positive for depressed mood. Negative for sleep disturbance. The patient is not nervous/anxious.     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 factor (HCC) 10/12/2016   Allergic rhinitis 08/10/2013   Gastro-esophageal reflux disease without esophagitis 06/15/2013   Hypothyroidism 06/15/2013   Atopic dermatitis 06/15/2013   Obesity 06/15/2013   Plantar fascial fibromatosis 06/15/2013   Pure hyperglyceridemia 06/15/2013   Palpitations 01/07/2013   Sinus tachycardia 01/06/2013   Low back pain 11/18/2012    Past Medical History:  Diagnosis Date   Hyperthyroidism    Mixed connective tissue  disease (HCC)    Rheumatoid arthritis (HCC)    Tachycardia     Family History  Problem Relation Age of Onset   Thyroid disease Mother    Arthritis Father    Atrial fibrillation Father    Congestive Heart Failure Father    Breast cancer Maternal Aunt    Past Surgical History:  Procedure Laterality Date   BILATERAL CARPAL TUNNEL RELEASE Bilateral    CESAREAN SECTION     TUBAL LIGATION     Social History   Social History Narrative   Not on file   Immunization History  Administered Date(s) Administered   Influenza,inj,Quad PF,6+ Mos 01/07/2013, 01/31/2017, 01/15/2019, 04/07/2020, 03/08/2022   Influenza-Unspecified 02/13/2017   Pneumococcal Conjugate-13 06/11/2017   Tdap 01/18/2005, 08/10/2015     Objective: Vital Signs: BP 109/73 (BP Location: Left Arm, Patient Position: Sitting, Cuff Size: Normal)   Pulse 84   Resp 14   Ht 5\' 1"  (1.549 m)   Wt 203 lb (92.1 kg)   LMP 08/08/2022   BMI 38.36 kg/m    Physical Exam Constitutional:      Appearance: She is obese.  Eyes:     Conjunctiva/sclera: Conjunctivae normal.  Cardiovascular:     Rate and Rhythm: Normal rate and regular rhythm.  Pulmonary:  Effort: Pulmonary effort is normal.     Breath sounds: Normal breath sounds.  Lymphadenopathy:     Cervical: No cervical adenopathy.  Skin:    General: Skin is warm and dry.     Findings: No rash.  Neurological:     Mental Status: She is alert.  Psychiatric:        Mood and Affect: Mood normal.      Musculoskeletal Exam:  Elbows full ROM no tenderness or swelling Wrists full ROM no tenderness or swelling Fingers full ROM no tenderness or swelling Tenderness to pressure along the borders of sternum and [place] joints, less tenderness along flanks and lower ribs, no pain provoked with supine position or deep inspiration Tenderness to pressure midline and bilateral paraspinal muscles throughout her back from top to bottom, some radiation of symptoms into upper arm Knees  full ROM no tenderness or swelling   Investigation: No additional findings.  Imaging: No results found.  Recent Labs: Lab Results  Component Value Date   WBC 5.4 06/25/2022   HGB 12.6 06/25/2022   PLT 308 06/25/2022   NA 137 06/25/2022   K 4.3 06/25/2022   CL 105 06/25/2022   CO2 25 06/25/2022   GLUCOSE 82 06/25/2022   BUN 10 06/25/2022   CREATININE 0.64 06/25/2022   BILITOT 0.3 06/25/2022   ALKPHOS 38 08/18/2016   AST 16 06/25/2022   ALT 12 06/25/2022   PROT 7.5 06/25/2022   ALBUMIN 3.5 08/18/2016   CALCIUM 9.0 06/25/2022   GFRAA >60 08/18/2016    Speciality Comments: No specialty comments available.  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  Checking sed rate and CRP for disease activity assessment now after resuming methotrexate.  If markedly elevated may recommend additional short-term steroid or other treatment.  Plan to continue methotrexate 20 mg subcu weekly and folic acid 1 mg daily.  Raynaud's disease without gangrene  No new complications no digital pitting skin lesion or skin hardening.  Mixed connective tissue disease (HCC)  High risk medication use - Plan: CBC with Differential/Platelet, COMPLETE METABOLIC PANEL WITH GFR  Checking CBC and CMP for medication monitoring after resuming methotrexate.  No interval infections.  Discussed risk of toxicity increased by medication interaction with omeprazole but that is dose dependent and can continue but with the current monitoring, at worst would need to decrease her dose.  Myofascial pain  Widespread back pain  consistent with myofascial pain and has previous diagnosis of fibromyalgia.  On Lyrica and Cymbalta for this already.  No current red flags if this flareup persist longer duration and with normal inflammatory markers might benefit with addition of muscle relaxers or trigger point injection.  Orders: Orders Placed This Encounter  Procedures   Sedimentation rate   C-reactive protein   CBC with Differential/Platelet   COMPLETE METABOLIC PANEL WITH GFR   Meds ordered this encounter  Medications   methotrexate 50 MG/2ML injection    Sig: Inject 0.8 mLs (20 mg total) into the skin once a week.    Dispense:  4 mL    Refill:  2     Follow-Up Instructions: Return in about 3 months (around 11/29/2022) for MCTD/RA on MTX f/u  3mos.   Fuller Plan, MD  Note - This record has been created using [employer].  Chart creation errors have been sought, but may not always  have been located. Such creation errors do not reflect on  the standard of medical care.

## 2022-08-30 ENCOUNTER — Encounter: Payer: Self-pay | Admitting: Internal Medicine

## 2022-08-30 LAB — COMPLETE METABOLIC PANEL WITH GFR
AG Ratio: 1 (calc) (ref 1.0–2.5)
ALT: 22 U/L (ref 6–29)
AST: 23 U/L (ref 10–35)
Albumin: 3.8 g/dL (ref 3.6–5.1)
Alkaline phosphatase (APISO): 28 U/L — ABNORMAL LOW (ref 31–125)
BUN: 13 mg/dL (ref 7–25)
CO2: 26 mmol/L (ref 20–32)
Calcium: 9 mg/dL (ref 8.6–10.2)
Chloride: 105 mmol/L (ref 98–110)
Creat: 0.7 mg/dL (ref 0.50–0.99)
Globulin: 3.7 g/dL (calc) (ref 1.9–3.7)
Glucose, Bld: 82 mg/dL (ref 65–99)
Potassium: 4.9 mmol/L (ref 3.5–5.3)
Sodium: 136 mmol/L (ref 135–146)
Total Bilirubin: 0.5 mg/dL (ref 0.2–1.2)
Total Protein: 7.5 g/dL (ref 6.1–8.1)
eGFR: 109 mL/min/{1.73_m2} (ref >=60)

## 2022-08-30 LAB — CBC WITH DIFFERENTIAL/PLATELET
Absolute Monocytes: 479 cells/uL (ref 200–950)
Basophils Absolute: 22 cells/uL (ref 0–200)
Basophils Relative: 0.6 %
Eosinophils Absolute: 50 cells/uL (ref 15–500)
Eosinophils Relative: 1.4 %
HCT: 39.5 % (ref 35.0–45.0)
Hemoglobin: 12.8 g/dL (ref 11.7–15.5)
Lymphs Abs: 929 cells/uL (ref 850–3900)
MCH: 28.7 pg (ref 27.0–33.0)
MCHC: 32.4 g/dL (ref 32.0–36.0)
MCV: 88.6 fL (ref 80.0–100.0)
MPV: 10.4 fL (ref 7.5–12.5)
Monocytes Relative: 13.3 %
Neutro Abs: 2120 cells/uL (ref 1500–7800)
Neutrophils Relative %: 58.9 %
Platelets: 342 10*3/uL (ref 140–400)
RBC: 4.46 10*6/uL (ref 3.80–5.10)
RDW: 15.3 % — ABNORMAL HIGH (ref 11.0–15.0)
Total Lymphocyte: 25.8 %
WBC: 3.6 10*3/uL — ABNORMAL LOW (ref 3.8–10.8)

## 2022-08-30 LAB — C-REACTIVE PROTEIN: CRP: 3.9 mg/L (ref <8.0)

## 2022-08-30 LAB — SEDIMENTATION RATE: Sed Rate: 31 mm/h — ABNORMAL HIGH (ref 0–20)

## 2022-08-30 NOTE — Telephone Encounter (Signed)
Labs were drawn in office yesterday, 08/29/2022. Please advise.

## 2022-09-01 DIAGNOSIS — Z419 Encounter for procedure for purposes other than remedying health state, unspecified: Secondary | ICD-10-CM | POA: Diagnosis not present

## 2022-09-03 MED ORDER — PREDNISONE 5 MG PO TABS: ORAL_TABLET | ORAL | 0 refills | Status: AC

## 2022-09-03 NOTE — Progress Notes (Signed)
I sent prednisone taper Rx now.

## 2022-09-03 NOTE — Addendum Note (Signed)
Addended by: Fuller Plan on: 09/03/2022 03:05 PM   Modules accepted: Orders

## 2022-09-13 DIAGNOSIS — R002 Palpitations: Secondary | ICD-10-CM | POA: Diagnosis not present

## 2022-09-17 ENCOUNTER — Ambulatory Visit: Payer: Medicaid Other

## 2022-09-17 DIAGNOSIS — D120 Benign neoplasm of cecum: Secondary | ICD-10-CM | POA: Diagnosis not present

## 2022-09-17 DIAGNOSIS — R1314 Dysphagia, pharyngoesophageal phase: Secondary | ICD-10-CM | POA: Diagnosis not present

## 2022-09-17 DIAGNOSIS — K640 First degree hemorrhoids: Secondary | ICD-10-CM | POA: Diagnosis not present

## 2022-09-17 DIAGNOSIS — R1319 Other dysphagia: Secondary | ICD-10-CM | POA: Diagnosis not present

## 2022-09-17 DIAGNOSIS — Z1211 Encounter for screening for malignant neoplasm of colon: Secondary | ICD-10-CM | POA: Diagnosis not present

## 2022-10-01 DIAGNOSIS — Z419 Encounter for procedure for purposes other than remedying health state, unspecified: Secondary | ICD-10-CM | POA: Diagnosis not present

## 2022-10-03 ENCOUNTER — Encounter: Payer: Self-pay | Admitting: Internal Medicine

## 2022-10-03 DIAGNOSIS — M351 Other overlap syndromes: Secondary | ICD-10-CM

## 2022-10-03 DIAGNOSIS — M05741 Rheumatoid arthritis with rheumatoid factor of right hand without organ or systems involvement: Secondary | ICD-10-CM

## 2022-10-08 ENCOUNTER — Telehealth: Payer: Self-pay

## 2022-10-08 DIAGNOSIS — L0292 Furuncle, unspecified: Secondary | ICD-10-CM | POA: Diagnosis not present

## 2022-10-08 DIAGNOSIS — L732 Hidradenitis suppurativa: Secondary | ICD-10-CM | POA: Diagnosis not present

## 2022-10-08 DIAGNOSIS — J309 Allergic rhinitis, unspecified: Secondary | ICD-10-CM | POA: Diagnosis not present

## 2022-10-08 MED ORDER — PREDNISONE 5 MG PO TABS: 5.0000 mg | ORAL_TABLET | Freq: Every day | ORAL | 1 refills | Status: DC

## 2022-10-08 NOTE — Telephone Encounter (Signed)
Patient advised Dr. Dimple Casey is sending Rx for prednisone to continue low dose 5 mg daily till we follow up next month . Her last labs did show increased inflammation. Dr. Dimple Casey would not recommend going back on the higher dose for prolonged time due to side effects. Patient verbalized understanding.    Patient states she scheduled an appointment with her PCP to be seen.

## 2022-10-08 NOTE — Telephone Encounter (Signed)
Patient advised Dr. Rice is sending Rx for prednisone to continue low dose 5 mg daily till we follow up next month . Her last labs did show increased inflammation. Dr. Rice would not recommend going back on the higher dose for prolonged time due to side effects. Patient verbalized understanding.    Patient states she scheduled an appointment with her PCP to be seen. 

## 2022-10-08 NOTE — Telephone Encounter (Signed)
I am sending Rx for prednisone to continue low dose 5 mg daily till we follow up next month . Her last labs did show increased inflammation. I would not recommend going back on the higher dose for prolonged time due to side effects.

## 2022-10-23 ENCOUNTER — Other Ambulatory Visit: Payer: Self-pay

## 2022-10-23 ENCOUNTER — Ambulatory Visit: Payer: Medicaid Other | Admitting: Student in an Organized Health Care Education/Training Program

## 2022-10-23 DIAGNOSIS — R0602 Shortness of breath: Secondary | ICD-10-CM

## 2022-10-24 ENCOUNTER — Ambulatory Visit: Payer: Medicaid Other | Admitting: Student in an Organized Health Care Education/Training Program

## 2022-11-01 DIAGNOSIS — Z419 Encounter for procedure for purposes other than remedying health state, unspecified: Secondary | ICD-10-CM | POA: Diagnosis not present

## 2022-11-16 DIAGNOSIS — R002 Palpitations: Secondary | ICD-10-CM | POA: Diagnosis not present

## 2022-11-16 NOTE — Progress Notes (Unsigned)
Office Visit Note  Patient: Jeanne Bush             Date of Birth: 1975-12-26           MRN: [id number]             PCP: Araceli Bouche, DO Referring: Araceli Bouche, DO Visit Date: 11/30/2022   Subjective:  Follow-up (Aching )   History of Present Illness: Jeanne Bush is a 47 y.o. female here for follow up with mixed connective tissue disease/RA overlap on methotrexate 20 mg subcu weekly and folic acid 1 mg daily.  After finishing the previous prednisone course developed a significant increase in joint pain and stiffness and muscular pain throughout upper and lower extremities.  Especially sore in her fingers and wrists.  Resuming prednisone at 5 mg daily helped a lot with these musculoskeletal symptoms.  Still having issues with chronic fatigue and brain fog type concentration difficulty.  She is having some more indigestion due to stopping her omeprazole at recommendations of the pharmacist with starting methotrexate.  Also found to have a rash on her buttocks and gluteal region is recommended to see dermatology for suspicion of possible hidradenitis suppurativa.  No previous history of similar chronic skin lesions no involvement in the axilla.   Previous HPI 08/29/2022 Jeanne Bush is a 47 y.o. female here for follow up with mixed connective tissue disease/RA overlap after resuming methotrexate 20 mg subcu weekly and folic acid 1 mg daily since our visit in March.  She did not notice any appreciable side effect while resuming methotrexate but is also still having a good amount of symptoms with pain and stiffness and fatigue.  For about the past 1 week he has significant increase in pain especially throughout her chest and back.  This is most noticeable when trying to roll over in bed or with any direct pressure on these areas.  Has not experienced any new peripheral joint swelling.  She is also having more trouble with acid reflux after discontinuing omeprazole at her pharmacist  recommendation since starting methotrexate.  This drug was originally recommended based on her ENT with what sounds like laryngal pharyngeal reflux symptoms. Does not also complaining of feeling excessively dry all the time despite drinking a lot of water daily and frequent urination.  Not specifically dryness of the eyes or mouth although she does get benefit using the eyedrops.   Previous HPI 06/25/22 Jeanne Bush is a 47 y.o. 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 Marland Mcalpine at [facility]. She was on subcutaneous methotrexate treatment and cymbalta, lyrica, and voltaren for symptoms due to arthritis and the fibromyalgia. Labs positive for ANA, RNP, and RF. She has had some shortness of breath and pulmonary studies indicative for small airways disease with no pulmonary fibrosis on previous CT chest exam. History of eczema treated with cellcept and methotrexate, had previous biopsy for this in 2014. Hydroxychloroquine was stopped due to increasing dizziness symptoms.  She has now been off methotrexate due to lack of clinical follow-up after she lost the insurance.  Off the medication has experienced some increase in joint pain and stiffness as well as eczematous rashes. She experiences some swelling as well as pain and stiffness in both hands.  Had carpal tunnel release surgery last year without any great relief of symptoms and is still getting problems with the hand and wrist pains and  sometimes numbness.  Does not see much swelling in her other joints.  She pretty often experiences pain across the middle and upper chest region this had been suspected as pleurisy by some previous providers no particular diagnostic testing or findings associated with this. She has longstanding history of Raynaud's symptoms not associated with any ulcers or digital pitting. Eczema skin  rashes on the chest are new since.  Previously had axillary skin lesions questionable for hidradenitis suppurativa but these pretty much resolved with stopping shaving. She originally had onset of chronic joint pain and sensitivity since she was a teenager.  The symptoms were attributed as growing pains but the generalized pain and sensitivity she has now remained similar to what she experienced at that time.  She gets sensitivity to touch pretty much all over on a daily basis.  Also with persistent brain fog and fatigue.  She has chronic mild constipation symptoms but without any serious GI complications.       Labs reviewed 2019 SSA pos U1RNP >150   07/2016 ANA pos RNP pos RF 36 HCV neg HBV neg   02/25/19 HRCT Chest w/o Contrast No pulmonary fibrosis   Review of Systems  Constitutional:  Positive for fatigue.  HENT:  Positive for mouth dryness. Negative for mouth sores.   Eyes:  Positive for dryness.  Respiratory:  Positive for shortness of breath.   Cardiovascular:  Positive for chest pain and palpitations.  Gastrointestinal:  Positive for constipation. Negative for blood in stool and diarrhea.  Endocrine: Negative for increased urination.  Genitourinary:  Negative for involuntary urination.  Musculoskeletal:  Positive for joint pain, gait problem, joint pain, joint swelling, myalgias, muscle weakness, morning stiffness, muscle tenderness and myalgias.  Skin:  Positive for rash, hair loss and sensitivity to sunlight. Negative for color change.  Allergic/Immunologic: Negative for susceptible to infections.  Neurological:  Positive for dizziness and headaches.  Hematological:  Negative for swollen glands.  Psychiatric/Behavioral:  Positive for depressed mood. Negative for sleep disturbance. The patient is not nervous/anxious.     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 rheumatoid factor (HCC) 10/12/2016   Allergic rhinitis 08/10/2013   Gastro-esophageal reflux disease without esophagitis 06/15/2013   Hypothyroidism 06/15/2013   Atopic dermatitis 06/15/2013   Obesity 06/15/2013   Plantar fascial fibromatosis 06/15/2013   Pure hyperglyceridemia 06/15/2013   Palpitations 01/07/2013   Sinus tachycardia 01/06/2013   Low back pain 11/18/2012    Past Medical History:  Diagnosis Date   Hyperthyroidism    Mixed connective tissue disease (HCC)    Rheumatoid arthritis (HCC)    Tachycardia     Family History  Problem Relation Age of Onset   Thyroid disease Mother    Arthritis Father    Atrial fibrillation Father    Congestive Heart Failure Father    Breast cancer Maternal Aunt    Past Surgical History:  Procedure Laterality Date   BILATERAL CARPAL TUNNEL RELEASE Bilateral    CESAREAN SECTION     TUBAL LIGATION     Social  History   Social History Narrative   Not on file   Immunization History  Administered Date(s) Administered   Influenza,inj,Quad PF,6+ Mos 01/07/2013, 01/31/2017, 01/15/2019, 04/07/2020, 03/08/2022   Influenza-Unspecified 02/13/2017   Pneumococcal Conjugate-13 06/11/2017   Tdap 01/18/2005, 08/10/2015     Objective: Vital Signs: BP 99/66 (BP Location: Left Arm, Patient Position: Sitting, Cuff Size: Normal)   Pulse 73   Resp 15   Ht 5\' 1"  (1.549 m)   Wt 205 lb (93 kg)   BMI 38.73 kg/m    Physical Exam Constitutional:      Appearance: She is obese.  Eyes:     Conjunctiva/sclera: Conjunctivae normal.  Cardiovascular:     Rate and  Rhythm: Normal rate and regular rhythm.  Pulmonary:     Effort: Pulmonary effort is normal.     Breath sounds: Normal breath sounds.  Musculoskeletal:     Right lower leg: No edema.     Left lower leg: No edema.  Lymphadenopathy:     Cervical: No cervical adenopathy.  Skin:    General: Skin is warm and dry.     Findings: No rash.  Neurological:     Mental Status: She is alert.  Psychiatric:        Mood and Affect: Mood normal.      Musculoskeletal Exam:  Elbows full ROM no tenderness or swelling Wrists full ROM no tenderness or swelling Fingers full ROM no tenderness or swelling Midline and paraspinal tenderness in upper and lower back, no radiation into arms or legs Knees full ROM no tenderness or swelling  Investigation: No additional findings.  Imaging: No results found.  Recent Labs: Lab Results  Component Value Date   WBC 5.8 11/30/2022   HGB 12.7 11/30/2022   PLT 364 11/30/2022   NA 137 11/30/2022   K 4.9 11/30/2022   CL 103 11/30/2022   CO2 26 11/30/2022   GLUCOSE 92 11/30/2022   BUN 11 11/30/2022   CREATININE 0.77 11/30/2022   BILITOT 0.3 11/30/2022   ALKPHOS 38 08/18/2016   AST 17 11/30/2022   ALT 19 11/30/2022   PROT 7.5 11/30/2022   ALBUMIN 3.5 08/18/2016   CALCIUM 9.1 11/30/2022   GFRAA >60 08/18/2016    Speciality Comments: No specialty comments available.  Procedures:  No procedures performed Allergies: Hydroxychloroquine   Assessment / Plan:     Visit Diagnoses: Rheumatoid arthritis involving both hands with positive rheumatoid factor (HCC) - Plan: Sedimentation rate, methotrexate 50 MG/2ML injection, folic acid (FOLVITE) 1 MG tablet, predniSONE (DELTASONE) 5 MG tablet  Inflammatory arthritis appears well-controlled most of her symptom complaints currently look more consistent for fibromyalgia or myofascial pain.  I think currently continuing the low-dose prednisone is a reasonable option versus switching to biologic DMARD for now.  Agree  with getting her evaluated by dermatology if she does have HS targeted treatment for both this and inflammatory arthritis would be a good option.  Continue methotrexate 20 mg subcu weekly folic acid 1 mg daily prednisone 5 mg daily.  High risk medication use - methotrexate 20 mg subcu weekly and folic acid 1 mg daily - Plan: CBC with Differential/Platelet, COMPLETE METABOLIC PANEL WITH GFR  Checking CBC and CMP for medication monitoring continue use of methotrexate.  Discussed potential for PPI interaction methotrexate raising the serum concentration.  She is not on a maximum allowable weekly dose. I believe she should be fine to resume a low-dose PPI for GERD without requiring more than usual every 70-month lab  monitoring.  Raynaud's disease without gangrene  Not in exacerbation.  No digital pitting.  Myofascial pain - On Lyrica and Cymbalta for this already - Plan: cyclobenzaprine (FLEXERIL) 5 MG tablet  Recommend trial of adding that his muscle relaxer at night start with Flexeril 5 mg.  Concerning titrate up if limited response but tolerable her main concern is for excessive grogginess.  Recommend her to check out online patient fibromyalgia self-management guide.  Orders: Orders Placed This Encounter  Procedures   Sedimentation rate   CBC with Differential/Platelet   COMPLETE METABOLIC PANEL WITH GFR   Meds ordered this encounter  Medications   cyclobenzaprine (FLEXERIL) 5 MG tablet    Sig: Take 1 tablet (5 mg total) by mouth at bedtime as needed for muscle spasms.    Dispense:  30 tablet    Refill:  2   methotrexate 50 MG/2ML injection    Sig: Inject 0.8 mLs (20 mg total) into the skin once a week.    Dispense:  4 mL    Refill:  2   folic acid (FOLVITE) 1 MG tablet    Sig: Take 1 tablet (1 mg total) by mouth daily.    Dispense:  90 tablet    Refill:  3   predniSONE (DELTASONE) 5 MG tablet    Sig: Take 1 tablet (5 mg total) by mouth daily with breakfast.    Dispense:  90  tablet    Refill:  0     Follow-Up Instructions: No follow-ups on file.   Fuller Plan, MD  Note - This record has been created using [employer].  Chart creation errors have been sought, but may not always  have been located. Such creation errors do not reflect on  the standard of medical care.

## 2022-11-22 ENCOUNTER — Ambulatory Visit: Payer: Medicaid Other | Attending: Student in an Organized Health Care Education/Training Program

## 2022-11-22 DIAGNOSIS — R0602 Shortness of breath: Secondary | ICD-10-CM | POA: Diagnosis not present

## 2022-11-22 DIAGNOSIS — J984 Other disorders of lung: Secondary | ICD-10-CM | POA: Insufficient documentation

## 2022-11-22 LAB — PULMONARY FUNCTION TEST ARMC ONLY
DL/VA % pred: 100 %
DL/VA: 4.5 ml/min/mmHg/L
DLCO unc % pred: 100 %
DLCO unc: 19.43 ml/min/mmHg
FEF 25-75 Post: 3.14 L/s
FEF 25-75 Pre: 2.23 L/s
FEF2575-%Change-Post: 40 %
FEF2575-%Pred-Post: 112 %
FEF2575-%Pred-Pre: 80 %
FEV1-%Change-Post: 11 %
FEV1-%Pred-Post: 97 %
FEV1-%Pred-Pre: 87 %
FEV1-Post: 2.56 L
FEV1-Pre: 2.3 L
FEV1FVC-%Change-Post: 9 %
FEV1FVC-%Pred-Pre: 97 %
FEV6-%Change-Post: 2 %
FEV6-%Pred-Post: 92 %
FEV6-%Pred-Pre: 90 %
FEV6-Post: 2.96 L
FEV6-Pre: 2.9 L
FEV6FVC-%Pred-Post: 102 %
FEV6FVC-%Pred-Pre: 102 %
FVC-%Change-Post: 2 %
FVC-%Pred-Post: 90 %
FVC-%Pred-Pre: 88 %
FVC-Post: 2.96 L
FVC-Pre: 2.9 L
Post FEV1/FVC ratio: 87 %
Post FEV6/FVC ratio: 100 %
Pre FEV1/FVC ratio: 79 %
Pre FEV6/FVC Ratio: 100 %
RV % pred: 102 %
RV: 1.58 L
TLC % pred: 100 %
TLC: 4.64 L

## 2022-11-22 MED ORDER — ALBUTEROL SULFATE (2.5 MG/3ML) 0.083% IN NEBU
2.5000 mg | INHALATION_SOLUTION | Freq: Once | RESPIRATORY_TRACT | Status: AC
  Administered 2022-11-22: 2.5 mg via RESPIRATORY_TRACT
  Filled 2022-11-22: qty 3

## 2022-11-23 ENCOUNTER — Encounter: Payer: Self-pay | Admitting: Nurse Practitioner

## 2022-11-23 ENCOUNTER — Ambulatory Visit: Payer: Medicaid Other | Admitting: Nurse Practitioner

## 2022-11-23 VITALS — BP 120/82 | HR 74 | Temp 97.3°F | Ht 61.0 in | Wt 205.6 lb

## 2022-11-23 DIAGNOSIS — R0602 Shortness of breath: Secondary | ICD-10-CM

## 2022-11-23 DIAGNOSIS — J4540 Moderate persistent asthma, uncomplicated: Secondary | ICD-10-CM

## 2022-11-23 DIAGNOSIS — M351 Other overlap syndromes: Secondary | ICD-10-CM | POA: Diagnosis not present

## 2022-11-23 LAB — NITRIC OXIDE: Nitric Oxide: 7

## 2022-11-23 NOTE — Patient Instructions (Addendum)
Continue Albuterol inhaler 2 puffs every 6 hours as needed for shortness of breath or wheezing. Notify if symptoms persist despite rescue inhaler/neb use.  Continue Advair 2 puffs Twice daily. Brush tongue and rinse mouth afterwards Continue flonase nasal spray 2 sprays each nostril daily Continue singulair 1 tab At bedtime  Continue prednisone as directed by your rheumatologist   Repeat PFTs in 6 months  Follow up with Dr. Aundria Rud in 6 months after PFTs. If symptoms worsen, please contact office for sooner follow up or seek emergency care.

## 2022-11-23 NOTE — Assessment & Plan Note (Signed)
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 She has improvement with ICS/LABA therapy. FeNO normal today; although, she is on steroids. No changes to current regimen. Action plan in place. Encouraged to work on graded exercises.   Patient Instructions  Continue Albuterol inhaler 2 puffs every 6 hours as needed for shortness of breath or wheezing. Notify if symptoms persist despite rescue inhaler/neb use.  Continue Advair 2 puffs Twice daily. Brush tongue and rinse mouth afterwards Continue flonase nasal spray 2 sprays each nostril daily Continue singulair 1 tab At bedtime  Continue prednisone as directed by your rheumatologist   Repeat PFTs in 6 months  Follow up with Dr. Aundria Rud in 6 months after PFTs. If symptoms worsen, please contact office for sooner follow up or seek emergency care.

## 2022-11-23 NOTE — Progress Notes (Signed)
@Patient  ID: Jeanne Bush, female    DOB: [birth year], 47 y.o.   MRN: [id number]  Chief Complaint  Patient presents with   Follow-up    SOB. No wheezing or cough.     Referring provider: Araceli Bouche, DO  HPI: 47 year old female, former smoker followed for shortness of breath.  She is a patient of Dr. Doreene Adas and last seen in office 04/26/2022 for pulmonary consult.  She has a history of mixed connective tissue disease (RA/SLE), previously on methotrexate and followed by rheumatology.  Past medical history significant for Raynaud's, allergic rhinitis, GERD, hypothyroid.  TEST/EVENTS:  05/08/2022 HRCT chest: No evidence of ILD.  No acute findings.  Calcified granulomas in the spleen. 11/22/2022 PFT: FVC 88, FEV1 87, ratio 87, TLC 100, DLCO 100.  Positive BD (11%)  04/26/2022: OV with Dr. Aundria Rud for pulmonary consult.  Mixed connective tissue disease.  Previous high-resolution CTs from [facility] did not show any interstitial lung disease back in 2020.  Previous pulmonary function testing was also normal.  Feeling like she is having some exertional dyspnea.  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  Symptoms are minimal.  Mostly with exertion.  No cough no chest tightness no wheezing.  Lost her insurance has been off of methotrexate.  Reestablishing with pulmonary and rheumatology.  11/23/2022: Today - follow up Patient presents today for follow up.  She has been feeling about the same since she was here last.  Still has some occasional shortness of breath with exertion and at times feels like she cannot get a big deep breath in.  She does feel like the Advair has helped.  Does not have any cough, chest congestion or wheezing.  Uses her albuterol a few times a month.  She is currently on prednisone for a RA flare.  FeNO 7 ppb  Allergies  Allergen Reactions   Hydroxychloroquine Palpitations    Immunization History  Administered Date(s) Administered    Influenza,inj,Quad PF,6+ Mos 01/07/2013, 01/31/2017, 01/15/2019, 04/07/2020, 03/08/2022   Influenza-Unspecified 02/13/2017   Pneumococcal Conjugate-13 06/11/2017   Tdap 01/18/2005, 08/10/2015    Past Medical History:  Diagnosis Date   Hyperthyroidism    Mixed connective tissue disease (HCC)    Rheumatoid arthritis (HCC)    Tachycardia     Tobacco History: Social History   Tobacco Use  Smoking Status Former   Current packs/day: 0.00   Average packs/day: 1 pack/day for 10.0 years (10.0 ttl pk-yrs)   Types: Cigarettes   Start date: 40   Quit date: 1997   Years since quitting: 27.6   Passive exposure: Current  Smokeless Tobacco Never   Counseling given: Not Answered   Outpatient Medications Prior to Visit  Medication Sig Dispense Refill   acetaminophen (TYLENOL) 500 MG tablet Take by mouth.     ADVAIR HFA 115-21 MCG/ACT inhaler Inhale 2 puffs into the lungs 2 (two) times daily.     albuterol (VENTOLIN HFA) 108 (90 Base) MCG/ACT inhaler Inhale 2 puffs into the lungs every 6 (six) hours as needed.     Carboxymethylcellulose Sodium (THERATEARS) 0.25 % SOLN Apply to eye.     Coenzyme Q10 (CO Q 10 PO) Take by mouth.     Cyanocobalamin (HM SUPER VITAMIN B12 PO) Take by mouth.     DULoxetine (CYMBALTA) 30 MG capsule Take by mouth.     fluocinolone (VANOS) 0.01 % cream Apply topically.     fluticasone (FLONASE) 50 MCG/ACT nasal spray Place  1 spray into the nose daily.     folic acid (FOLVITE) 1 MG tablet Take 1 tablet (1 mg total) by mouth daily. 90 tablet 1   ibuprofen (ADVIL) 200 MG tablet Take by mouth.     levothyroxine (SYNTHROID) 137 MCG tablet Take 137 mcg by mouth daily before breakfast.     MAGNESIUM PO Take by mouth.     methotrexate 50 MG/2ML injection Inject 0.8 mLs (20 mg total) into the skin once a week. 4 mL 2   montelukast (SINGULAIR) 10 MG tablet Take 10 mg by mouth at bedtime.     Multiple Vitamin (MULTI-VITAMIN) tablet Take 1 tablet by mouth daily.      naproxen (NAPROSYN) 500 MG tablet Take 1 tablet by mouth as needed.     Omega-3 Fatty Acids (FISH OIL PO) Take by mouth.     predniSONE (DELTASONE) 5 MG tablet Take 1 tablet (5 mg total) by mouth daily with breakfast. 30 tablet 1   pregabalin (LYRICA) 100 MG capsule Take 1 capsule by mouth 2 (two) times daily.     propranolol (INDERAL) 10 MG tablet Take 1 tablet by mouth 3 (three) times daily.     terbinafine (LAMISIL) 1 % cream Apply topically 2 (two) times daily.     triamcinolone cream (KENALOG) 0.5 % Apply 1 Application topically as needed.     TUBERCULIN SYR 1CC/26GX3/8" 26G X 3/8" 1 ML MISC For injection of subcutaneous methotrexate once weekly 25 each 0   ibuprofen (ADVIL,MOTRIN) 600 MG tablet Take 1 tablet (600 mg total) by mouth every 6 (six) hours as needed for mild pain or moderate pain. (Patient not taking: Reported on 08/29/2022) 20 tablet 0   omeprazole (PRILOSEC) 20 MG capsule Take 20 mg by mouth daily. (Patient not taking: Reported on 06/25/2022)     omeprazole (PRILOSEC) 40 MG capsule Take 40 mg by mouth in the morning and at bedtime. (Patient not taking: Reported on 08/29/2022)     ondansetron (ZOFRAN ODT) 4 MG disintegrating tablet Take 1 tablet (4 mg total) by mouth every 8 (eight) hours as needed for nausea or vomiting. (Patient not taking: Reported on 08/29/2022) 20 tablet 0   No facility-administered medications prior to visit.     Review of Systems:   Constitutional: No weight loss or gain, night sweats, fevers, chills, or lassitude. +chronic fatigue  HEENT: No headaches, difficulty swallowing, tooth/dental problems, or sore throat. No sneezing, itching, ear ache, nasal congestion, or post nasal drip CV:  No chest pain, orthopnea, PND, swelling in lower extremities, anasarca, dizziness, palpitations, syncope Resp: + shortness of breath with exertion. No excess mucus or change in color of mucus. No productive or non-productive. No hemoptysis. No wheezing.  No chest wall  deformity GI:  No heartburn, indigestion Skin: No rash, lesions, ulcerations MSK:  +chronic joint pains Neuro: No dizziness or lightheadedness.  Psych: No depression or anxiety. Mood stable.     Physical Exam:  BP 120/82 (BP Location: Left Arm, Cuff Size: Normal)   Pulse 74   Temp (!) 97.3 F (36.3 C)   Ht 5\' 1"  (1.549 m)   Wt 205 lb 9.6 oz (93.3 kg)   SpO2 98%   BMI 38.85 kg/m   GEN: Pleasant, interactive, well-appearing; in no acute distress. HEENT:  Normocephalic and atraumatic. PERRLA. Sclera white. Nasal turbinates pink, moist and patent bilaterally. No rhinorrhea present. Oropharynx pink and moist, without exudate or edema. No lesions, ulcerations, or postnasal drip.  NECK:  Supple w/  fair ROM. No JVD present. Normal carotid impulses w/o bruits. Thyroid symmetrical with no goiter or nodules palpated. No lymphadenopathy.   CV: RRR, no m/r/g, no peripheral edema. Pulses intact, +2 bilaterally. No cyanosis, pallor or clubbing. PULMONARY:  Unlabored, regular breathing. Clear bilaterally A&P w/o wheezes/rales/rhonchi. No accessory muscle use.  GI: BS present and normoactive. Soft, non-tender to palpation. No organomegaly or masses detected. MSK: No erythema, warmth or tenderness. Cap refil <2 sec all extrem. No deformities or joint swelling noted.  Neuro: A/Ox3. No focal deficits noted.   Skin: Warm, no lesions or rashe Psych: Normal affect and behavior. Judgement and thought content appropriate.     Lab Results:  CBC    Component Value Date/Time   WBC 3.6 (L) 08/29/2022 1328   RBC 4.46 08/29/2022 1328   HGB 12.8 08/29/2022 1328   HGB 13.5 07/02/2013 1634   HCT 39.5 08/29/2022 1328   HCT 40.7 07/02/2013 1634   PLT 342 08/29/2022 1328   PLT 353 07/02/2013 1634   MCV 88.6 08/29/2022 1328   MCV 89 07/02/2013 1634   MCH 28.7 08/29/2022 1328   MCHC 32.4 08/29/2022 1328   RDW 15.3 (H) 08/29/2022 1328   RDW 13.1 07/02/2013 1634   LYMPHSABS 929 08/29/2022 1328    LYMPHSABS 2.8 07/02/2013 1634   MONOABS 0.8 07/02/2013 1634   EOSABS 50 08/29/2022 1328   EOSABS 0.2 07/02/2013 1634   BASOSABS 22 08/29/2022 1328   BASOSABS 0.1 07/02/2013 1634    BMET    Component Value Date/Time   NA 136 08/29/2022 1328   NA 138 07/02/2013 1634   K 4.9 08/29/2022 1328   K 4.0 07/02/2013 1634   CL 105 08/29/2022 1328   CL 103 07/02/2013 1634   CO2 26 08/29/2022 1328   CO2 28 07/02/2013 1634   GLUCOSE 82 08/29/2022 1328   GLUCOSE 85 07/02/2013 1634   BUN 13 08/29/2022 1328   BUN 12 07/02/2013 1634   CREATININE 0.70 08/29/2022 1328   CALCIUM 9.0 08/29/2022 1328   CALCIUM 9.1 07/02/2013 1634   GFRNONAA >60 08/18/2016 1342   GFRNONAA >60 07/02/2013 1634   GFRAA >60 08/18/2016 1342   GFRAA >60 07/02/2013 1634    BNP No results found for: "BNP"   Imaging:  No results found.  albuterol (PROVENTIL) (2.5 MG/3ML) 0.083% nebulizer solution 2.5 mg     Date Action Dose Route User   11/22/2022 1023 Given 2.5 mg Nebulization Karn Cassis, RT          Latest Ref Rng & Units 11/22/2022   10:42 AM  PFT Results  FVC-Pre L 2.90  P  FVC-Predicted Pre % 88  P  FVC-Post L 2.96  P  FVC-Predicted Post % 90  P  Pre FEV1/FVC % % 79  P  Post FEV1/FCV % % 87  P  FEV1-Pre L 2.30  P  FEV1-Predicted Pre % 87  P  FEV1-Post L 2.56  P  DLCO uncorrected ml/min/mmHg 19.43  P  DLCO [facility]% % 100  P  DLVA Predicted % 100  P  TLC L 4.64  P  TLC % Predicted % 100  P  RV % Predicted % 102  P    P Preliminary result    Lab Results  Component Value Date   NITRICOXIDE 7 11/23/2022        Assessment & Plan:   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 She has improvement  with ICS/LABA therapy. FeNO normal today; although, she is on steroids. No changes to current regimen. Action plan in place. Encouraged to work on graded exercises.   Patient Instructions  Continue Albuterol inhaler  2 puffs every 6 hours as needed for shortness of breath or wheezing. Notify if symptoms persist despite rescue inhaler/neb use.  Continue Advair 2 puffs Twice daily. Brush tongue and rinse mouth afterwards Continue flonase nasal spray 2 sprays each nostril daily Continue singulair 1 tab At bedtime  Continue prednisone as directed by your rheumatologist   Repeat PFTs in 6 months  Follow up with Dr. Aundria Rud in 6 months after PFTs. If symptoms worsen, please contact office for sooner follow up or seek emergency care.    Mixed connective tissue disease (HCC) Mixed CTD (RA/SLE). No evidence of ILD on recent HRCT. PFTs with normal lung function and diffusing capacity. Will continue to monitor.    I spent 32 minutes of dedicated to the care of this patient on the date of this encounter to include pre-visit review of records, face-to-face time with the patient discussing conditions above, post visit ordering of testing, clinical documentation with the electronic health record, making appropriate referrals as documented, and communicating necessary findings to members of the patients care team.  Noemi Chapel, NP 11/23/2022  Pt aware and understands NP's role.

## 2022-11-23 NOTE — Assessment & Plan Note (Signed)
Mixed CTD (RA/SLE). No evidence of ILD on recent HRCT. PFTs with normal lung function and diffusing capacity. Will continue to monitor.

## 2022-11-30 ENCOUNTER — Ambulatory Visit: Payer: Medicaid Other | Attending: Internal Medicine | Admitting: Internal Medicine

## 2022-11-30 ENCOUNTER — Encounter: Payer: Self-pay | Admitting: Internal Medicine

## 2022-11-30 VITALS — BP 99/66 | HR 73 | Resp 15 | Ht 61.0 in | Wt 205.0 lb

## 2022-11-30 DIAGNOSIS — M351 Other overlap syndromes: Secondary | ICD-10-CM | POA: Diagnosis not present

## 2022-11-30 DIAGNOSIS — Z79899 Other long term (current) drug therapy: Secondary | ICD-10-CM

## 2022-11-30 DIAGNOSIS — M7918 Myalgia, other site: Secondary | ICD-10-CM | POA: Diagnosis not present

## 2022-11-30 DIAGNOSIS — Z7952 Long term (current) use of systemic steroids: Secondary | ICD-10-CM

## 2022-11-30 DIAGNOSIS — M05742 Rheumatoid arthritis with rheumatoid factor of left hand without organ or systems involvement: Secondary | ICD-10-CM | POA: Diagnosis not present

## 2022-11-30 DIAGNOSIS — I7300 Raynaud's syndrome without gangrene: Secondary | ICD-10-CM | POA: Diagnosis not present

## 2022-11-30 DIAGNOSIS — M05741 Rheumatoid arthritis with rheumatoid factor of right hand without organ or systems involvement: Secondary | ICD-10-CM

## 2022-11-30 MED ORDER — PREDNISONE 5 MG PO TABS: 5.0000 mg | ORAL_TABLET | Freq: Every day | ORAL | 0 refills | Status: DC

## 2022-11-30 MED ORDER — CYCLOBENZAPRINE HCL 5 MG PO TABS: 5.0000 mg | ORAL_TABLET | Freq: Every evening | ORAL | 2 refills | Status: DC | PRN

## 2022-11-30 MED ORDER — FOLIC ACID 1 MG PO TABS: 1.0000 mg | ORAL_TABLET | Freq: Every day | ORAL | 3 refills | Status: DC

## 2022-11-30 MED ORDER — METHOTREXATE SODIUM CHEMO INJECTION 50 MG/2ML: 20.0000 mg | INTRAMUSCULAR | 2 refills | Status: DC

## 2022-11-30 NOTE — Patient Instructions (Signed)
I recommend checking out the [employer] patient-centered guide for fibromyalgia and chronic pain management: [email or web address].[email or web address].[email or web address].edu   

## 2022-12-01 LAB — COMPLETE METABOLIC PANEL WITH GFR
AG Ratio: 1 (calc) (ref 1.0–2.5)
ALT: 19 U/L (ref 6–29)
AST: 17 U/L (ref 10–35)
Albumin: 3.8 g/dL (ref 3.6–5.1)
Alkaline phosphatase (APISO): 40 U/L (ref 31–125)
BUN: 11 mg/dL (ref 7–25)
CO2: 26 mmol/L (ref 20–32)
Calcium: 9.1 mg/dL (ref 8.6–10.2)
Chloride: 103 mmol/L (ref 98–110)
Creat: 0.77 mg/dL (ref 0.50–0.99)
Globulin: 3.7 g/dL (ref 1.9–3.7)
Glucose, Bld: 92 mg/dL (ref 65–99)
Potassium: 4.9 mmol/L (ref 3.5–5.3)
Sodium: 137 mmol/L (ref 135–146)
Total Bilirubin: 0.3 mg/dL (ref 0.2–1.2)
Total Protein: 7.5 g/dL (ref 6.1–8.1)
eGFR: 97 mL/min/{1.73_m2} (ref >=60)

## 2022-12-01 LAB — CBC WITH DIFFERENTIAL/PLATELET
Absolute Monocytes: 708 {cells}/uL (ref 200–950)
Basophils Absolute: 17 {cells}/uL (ref 0–200)
Basophils Relative: 0.3 %
Eosinophils Absolute: 110 {cells}/uL (ref 15–500)
Eosinophils Relative: 1.9 %
HCT: 39.4 % (ref 35.0–45.0)
Hemoglobin: 12.7 g/dL (ref 11.7–15.5)
Lymphs Abs: 1206 {cells}/uL (ref 850–3900)
MCH: 30.2 pg (ref 27.0–33.0)
MCHC: 32.2 g/dL (ref 32.0–36.0)
MCV: 93.8 fL (ref 80.0–100.0)
MPV: 10.3 fL (ref 7.5–12.5)
Monocytes Relative: 12.2 %
Neutro Abs: 3758 cells/uL (ref 1500–7800)
Neutrophils Relative %: 64.8 %
Platelets: 364 10*3/uL (ref 140–400)
RBC: 4.2 10*6/uL (ref 3.80–5.10)
RDW: 14.7 % (ref 11.0–15.0)
Total Lymphocyte: 20.8 %
WBC: 5.8 10*3/uL (ref 3.8–10.8)

## 2022-12-01 LAB — SEDIMENTATION RATE: Sed Rate: 28 mm/h — ABNORMAL HIGH (ref 0–20)

## 2022-12-02 DIAGNOSIS — Z419 Encounter for procedure for purposes other than remedying health state, unspecified: Secondary | ICD-10-CM | POA: Diagnosis not present

## 2022-12-04 DIAGNOSIS — R0609 Other forms of dyspnea: Secondary | ICD-10-CM | POA: Diagnosis not present

## 2022-12-06 DIAGNOSIS — R002 Palpitations: Secondary | ICD-10-CM | POA: Diagnosis not present

## 2023-01-01 DIAGNOSIS — Z419 Encounter for procedure for purposes other than remedying health state, unspecified: Secondary | ICD-10-CM | POA: Diagnosis not present

## 2023-01-10 ENCOUNTER — Encounter: Payer: Self-pay | Admitting: Dermatology

## 2023-01-10 ENCOUNTER — Ambulatory Visit: Payer: Medicaid Other | Admitting: Dermatology

## 2023-01-10 DIAGNOSIS — Z79899 Other long term (current) drug therapy: Secondary | ICD-10-CM | POA: Diagnosis not present

## 2023-01-10 DIAGNOSIS — L732 Hidradenitis suppurativa: Secondary | ICD-10-CM

## 2023-01-10 DIAGNOSIS — D492 Neoplasm of unspecified behavior of bone, soft tissue, and skin: Secondary | ICD-10-CM | POA: Diagnosis not present

## 2023-01-10 DIAGNOSIS — Z7189 Other specified counseling: Secondary | ICD-10-CM

## 2023-01-10 DIAGNOSIS — L859 Epidermal thickening, unspecified: Secondary | ICD-10-CM | POA: Diagnosis not present

## 2023-01-10 DIAGNOSIS — D489 Neoplasm of uncertain behavior, unspecified: Secondary | ICD-10-CM

## 2023-01-10 MED ORDER — MUPIROCIN 2 % EX OINT: TOPICAL_OINTMENT | CUTANEOUS | 1 refills | Status: AC

## 2023-01-10 NOTE — Progress Notes (Signed)
   New Patient Visit   Subjective  Jeanne Bush is a 47 y.o. female who presents for the following: Patient c/o a boil on her buttock for ~1 year, tried and failed several different antibiotics, sometimes she will have boils under her armpits. Patients PCP referred her here to discuss possible Hidradenitis Suppurativa  The patient has spots, moles and lesions to be evaluated, some may be new or changing and the patient may have concern these could be cancer.   The following portions of the chart were reviewed this encounter and updated as appropriate: medications, allergies, medical history  Review of Systems:  No other skin or systemic complaints except as noted in HPI or Assessment and Plan.  Objective  Well appearing patient in no apparent distress; mood and affect are within normal limits.   A focused examination was performed of the following areas:face,buttock    Relevant exam findings are noted in the Assessment and Plan.  Right Buttock Pink plaque with focal ulcerations on right medial buttock    Assessment & Plan   SUSPECT HIDRADENITIS SUPPURATIVA Exam: Clear in axillae. Lesion on buttock biopsied  Chronic and persistent condition with duration or expected duration over one year. Condition is symptomatic/ bothersome to patient. Not currently at goal.   Hidradenitis Suppurativa is a chronic; persistent; non-curable, but treatable condition due to abnormal inflamed sweat glands in the body folds (axilla, inframammary, groin, medial thighs), causing recurrent painful draining cysts and scarring. It can be associated with severe scarring acne and cysts; also abscesses and scarring of scalp. The goal is control and prevention of flares, as it is not curable. Scars are permanent and can be thickened. Treatment may include daily use of topical medication and oral antibiotics.  Oral isotretinoin may also be helpful.  For some cases, Humira or Cosentyx (biologic injections) may be  prescribed to decrease the inflammatory process and prevent flares.  When indicated, inflamed cysts may also be treated surgically.  Reviewed 11/30/22 rheum note Dr Sheliah Hatch  Treatment Plan: No treatment today we will wait for biopsy results  Will coordinate with Dr Dimple Casey on treatment that would cover RA and HS if HS is seen on biopsy   Hidradenitis suppurativa  Counseling and coordination of care  Long-term use of high-risk medication  Neoplasm of uncertain behavior Right Buttock  Skin / nail biopsy Type of biopsy: tangential   Informed consent: discussed and consent obtained   Timeout: patient name, date of birth, surgical site, and procedure verified   Procedure prep:  Patient was prepped and draped in usual sterile fashion Prep type:  Isopropyl alcohol Anesthesia: the lesion was anesthetized in a standard fashion   Anesthetic:  1% lidocaine w/ epinephrine 1-100,000 buffered w/ 8.4% NaHCO3 Instrument used: DermaBlade   Hemostasis achieved with: pressure and aluminum chloride   Outcome: patient tolerated procedure well   Post-procedure details: sterile dressing applied and wound care instructions given   Dressing type: bandage and petrolatum    Specimen 1 - Surgical pathology Differential Diagnosis: hidradenitis suppurativa vs condyloma acuminata vs cutaneous Crohn's disease vs herpes simplex  Check Margins: No  Start Mupirocin ointment apply to affected skin qd-bid during each bandage change     Return if symptoms worsen or fail to improve.  IAngelique Holm, CMA, am acting as scribe for Elie Goody, MD .   Documentation: I have reviewed the above documentation for accuracy and completeness, and I agree with the above.  Elie Goody, MD

## 2023-01-10 NOTE — Patient Instructions (Addendum)

## 2023-01-15 LAB — SURGICAL PATHOLOGY

## 2023-01-16 ENCOUNTER — Encounter: Payer: Self-pay | Admitting: Internal Medicine

## 2023-01-17 NOTE — Telephone Encounter (Signed)
Please contact her about scheduling a follow up appointment. I messaged with the dermatologist about treatment options but would want to discuss these at a visit and probably have to check other labs as well.

## 2023-01-17 NOTE — Progress Notes (Signed)
Office Visit Note  Patient: Jeanne Bush             Date of Birth: [birth year]           MRN: [id number]             PCP: Araceli Bouche, DO Referring: Araceli Bouche, DO Visit Date: 01/31/2023   Subjective:  Follow-up   History of Present Illness: Jeanne Bush is a 47 y.o. female here for follow up with mixed connective tissue disease/RA overlap on methotrexate 20 mg subcu weekly and folic acid 1 mg daily.  Since our last visit joint pain and stiffness is doing pretty well but remains on 5 mg prednisone daily.  She was able to see dermatology with [facility] for skin biopsy that confirmed hidradenitis.  Stillness we discussed previous recommendation for biologic DMARD treatment here to discuss medication and needs baseline laboratory testing.  Previous HPI 11/30/2022 Jeanne Bush is a 47 y.o. female here for follow up with mixed connective tissue disease/RA overlap on methotrexate 20 mg subcu weekly and folic acid 1 mg daily.  After finishing the previous prednisone course developed a significant increase in joint pain and stiffness and muscular pain throughout upper and lower extremities.  Especially sore in her fingers and wrists.  Resuming prednisone at 5 mg daily helped a lot with these musculoskeletal symptoms.  Still having issues with chronic fatigue and brain fog type concentration difficulty.  She is having some more indigestion due to stopping her omeprazole at recommendations of the pharmacist with starting methotrexate.  Also found to have a rash on her buttocks and gluteal region is recommended to see dermatology for suspicion of possible hidradenitis suppurativa.  No previous history of similar chronic skin lesions no involvement in the axilla.   Previous HPI 08/29/2022 Jeanne Bush is a 47 y.o. female here for follow up with mixed connective tissue disease/RA overlap after resuming methotrexate 20 mg subcu weekly and folic acid 1 mg daily since our visit in March.  She did  not notice any appreciable side effect while resuming methotrexate but is also still having a good amount of symptoms with pain and stiffness and fatigue.  For about the past 1 week he has significant increase in pain especially throughout her chest and back.  This is most noticeable when trying to roll over in bed or with any direct pressure on these areas.  Has not experienced any new peripheral joint swelling.  She is also having more trouble with acid reflux after discontinuing omeprazole at her pharmacist recommendation since starting methotrexate.  This drug was originally recommended based on her ENT with what sounds like laryngal pharyngeal reflux symptoms. Does not also complaining of feeling excessively dry all the time despite drinking a lot of water daily and frequent urination.  Not specifically dryness of the eyes or mouth although she does get benefit using the eyedrops.   Previous HPI 06/25/22 Jeanne Bush is a 47 y.o. 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 Marland Mcalpine at [facility]. She was on subcutaneous methotrexate treatment and cymbalta, lyrica, and voltaren for symptoms due to arthritis and the fibromyalgia. Labs positive for ANA, RNP, and RF. She has had some shortness of breath and pulmonary studies indicative for small airways disease with no pulmonary fibrosis on previous CT chest exam. History of eczema treated with cellcept and methotrexate, had previous biopsy for this  in 2014. Hydroxychloroquine was stopped due to increasing dizziness symptoms.  She has now been off methotrexate due to lack of clinical follow-up after she lost the insurance.  Off the medication has experienced some increase in joint pain and stiffness as well as eczematous rashes. She experiences some swelling as well as pain and stiffness in both hands.  Had carpal tunnel  release surgery last year without any great relief of symptoms and is still getting problems with the hand and wrist pains and sometimes numbness.  Does not see much swelling in her other joints.  She pretty often experiences pain across the middle and upper chest region this had been suspected as pleurisy by some previous providers no particular diagnostic testing or findings associated with this. She has longstanding history of Raynaud's symptoms not associated with any ulcers or digital pitting. Eczema skin rashes on the chest are new since.  Previously had axillary skin lesions questionable for hidradenitis suppurativa but these pretty much resolved with stopping shaving. She originally had onset of chronic joint pain and sensitivity since she was a teenager.  The symptoms were attributed as growing pains but the generalized pain and sensitivity she has now remained similar to what she experienced at that time.  She gets sensitivity to touch pretty much all over on a daily basis.  Also with persistent brain fog and fatigue.  She has chronic mild constipation symptoms but without any serious GI complications.       Labs reviewed 2019 SSA pos U1RNP >150   07/2016 ANA pos RNP pos RF 36 HCV neg HBV neg   02/25/19 HRCT Chest w/o Contrast No pulmonary fibrosis   Review of Systems  Constitutional:  Positive for fatigue.  HENT:  Positive for mouth dryness. Negative for mouth sores.   Eyes:  Positive for dryness.  Respiratory:  Positive for shortness of breath.   Cardiovascular:  Positive for palpitations. Negative for chest pain.  Gastrointestinal:  Negative for blood in stool, constipation and diarrhea.  Endocrine: Negative for increased urination.  Genitourinary:  Negative for involuntary urination.  Musculoskeletal:  Positive for joint pain, joint pain, joint swelling, myalgias, muscle weakness, morning stiffness, muscle tenderness and myalgias. Negative for gait problem.  Skin:   Positive for sensitivity to sunlight. Negative for color change, rash and hair loss.  Allergic/Immunologic: Negative for susceptible to infections.  Neurological:  Positive for headaches. Negative for dizziness.  Hematological:  Negative for swollen glands.  Psychiatric/Behavioral:  Negative for depressed mood and sleep disturbance. The patient is not nervous/anxious.     PMFS History:  Patient Active Problem List   Diagnosis Date Noted  . Hidradenitis suppurativa 01/31/2023  . Vitamin D deficiency 01/31/2023  . Moderate persistent asthma 11/23/2022  . Shortness of breath 11/22/2022  . Myofascial pain 08/29/2022  . Sjogren's syndrome (HCC) 03/21/2022  . 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 Mixed connective tissue disease (HCC) 03/08/2022  . Raynaud's disease without gangrene 03/08/2022  . Myopia of both eyes with astigmatism 01/15/2017  . Plaquenil causing adverse effect in therapeutic use 01/15/2017  . Rheumatoid arthritis involving both hands with positive rheumatoid factor (HCC) 10/12/2016  . Allergic rhinitis 08/10/2013  . Gastro-esophageal reflux disease without esophagitis 06/15/2013  . Hypothyroidism 06/15/2013  . Atopic dermatitis 06/15/2013  . Obesity 06/15/2013  . Plantar fascial fibromatosis 06/15/2013  . Pure hyperglyceridemia 06/15/2013  . Palpitations 01/07/2013  . Sinus tachycardia 01/06/2013  . Low back pain 11/18/2012    Past Medical History:  Diagnosis  Date  . Hyperthyroidism   . Mixed connective tissue disease (HCC)   . Rheumatoid arthritis (HCC)   . Tachycardia     Family History  Problem Relation Age of Onset  . Thyroid disease Mother   . Arthritis Father   . Atrial fibrillation Father   . Congestive Heart Failure Father   . Breast cancer Maternal Aunt    Past Surgical History:  Procedure Laterality Date  . BILATERAL CARPAL TUNNEL RELEASE Bilateral   . CESAREAN SECTION    . CESAREAN SECTION    . TUBAL LIGATION     Social  History   Social History Narrative  . Not on file   Immunization History  Administered Date(s) Administered  . Influenza,inj,Quad PF,6+ Mos 01/07/2013, 01/31/2017, 01/15/2019, 04/07/2020, 03/08/2022  . Influenza-Unspecified 02/13/2017  . Pneumococcal Conjugate-13 06/11/2017  . Tdap 01/18/2005, 08/10/2015     Objective: Vital Signs: BP 106/74 (BP Location: Left Arm, Patient Position: Sitting, Cuff Size: Normal)   Pulse 91   Resp 14   Ht 5' (1.524 m)   Wt 209 lb (94.8 kg)   LMP 01/31/2023   BMI 40.82 kg/m    Physical Exam Eyes:     Conjunctiva/sclera: Conjunctivae normal.  Cardiovascular:     Rate and Rhythm: Normal rate and regular rhythm.  Pulmonary:     Effort: Pulmonary effort is normal.     Breath sounds: Normal breath sounds.  Lymphadenopathy:     Cervical: No cervical adenopathy.  Skin:    General: Skin is warm and dry.  Neurological:     Mental Status: She is alert.  Psychiatric:        Mood and Affect: Mood normal.     Musculoskeletal Exam:  Elbows full ROM no tenderness or swelling Wrists full ROM no tenderness or swelling Fingers full ROM, stiffness no focal tenderness to pressure, no swelling Midline and paraspinal tenderness in upper and lower back, no radiation into arms or legs Knees full ROM no tenderness or swelling  Investigation: No additional findings.  Imaging: No results found.  Recent Labs: Lab Results  Component Value Date   WBC 5.8 11/30/2022   HGB 12.7 11/30/2022   PLT 364 11/30/2022   NA 137 11/30/2022   K 4.9 11/30/2022   CL 103 11/30/2022   CO2 26 11/30/2022   GLUCOSE 92 11/30/2022   BUN 11 11/30/2022   CREATININE 0.77 11/30/2022   BILITOT 0.3 11/30/2022   ALKPHOS 38 08/18/2016   AST 17 11/30/2022   ALT 19 11/30/2022   PROT 7.5 11/30/2022   ALBUMIN 3.5 08/18/2016   CALCIUM 9.1 11/30/2022   GFRAA >60 08/18/2016    Speciality Comments: No specialty comments available.  Procedures:  No procedures  performed Allergies: Hydroxychloroquine   Assessment / Plan:     Visit Diagnoses: Rheumatoid arthritis involving both hands with positive rheumatoid factor (HCC)   Inflammatory arthritis appears to be well-controlled currently on the methotrexate 20 mg subcu weekly and prednisone 5 mg daily.  Would like to get her off the prednisone if possible for long-term side effects.  Has been having ongoing skin lesions worst in the groin area recently biopsied confirming hidradenitis suppurativa.  Based on this along with her RA I think should be a good candidate for TNF inhibitor treatment would recommend starting on adalimumab 40 mg subcu q. 14 days.  If she does well with treatment we can try weaning off the remaining prednisone or possibly decrease methotrexate dose.  High risk medication use -  methotrexate 20 mg subcu weekly folic acid 1 mg daily  Checking CBC and CMP for medication monitoring on continued use of methotrexate today.  Also checking QuantiFERON baseline screening for TB before starting a TNF inhibitor.  Discussed risk of medication including injection reactions, cytopenias, hepatotoxicity, infections, and malignancy with long-term use.  She does not have any personal history of cancer.  Recently saw dermatology with no lesions concerning for malignancy.  No history of heart failure.  Long term (current) use of systemic steroids - prednisone 5 mg daily.  Raynaud's disease without gangrene  Myofascial pain - On Lyrica and Cymbalta for this already. Flexeril 5 mg.  Vitamin D deficiency  Last vitamin D level a year ago and was deficient at 2.  Will recheck level if it is still low recommend supplementation for mitigating osteoporosis risk with chronic inflammatory arthritis and steroid use.  Orders: Orders Placed This Encounter  Procedures  . Sedimentation rate  . QuantiFERON-TB Gold Plus  . VITAMIN D 25 Hydroxy (Vit-D Deficiency, Fractures)  . CBC with Differential/Platelet  .  COMPLETE METABOLIC PANEL WITH GFR   No orders of the defined types were placed in this encounter.    Follow-Up Instructions: Return in about 2 months (around 04/02/2023) for RA MTX/ADA start f/u 2mos.   Fuller Plan, MD  Note - This record has been created using [employer].  Chart creation errors have been sought, but may not always  have been located. Such creation errors do not reflect on  the standard of medical care.

## 2023-01-17 NOTE — Telephone Encounter (Signed)
Patient advised Please contact her about scheduling a follow up appointment. Dr. Dimple Casey messaged with the dermatologist about treatment options but would want to discuss these at a visit and probably have to check other labs as well. Patient verbalized understanding. Patient schedule an appointment on 01/31/2023.

## 2023-01-18 ENCOUNTER — Encounter: Payer: Self-pay | Admitting: Dermatology

## 2023-01-31 ENCOUNTER — Encounter: Payer: Self-pay | Admitting: Internal Medicine

## 2023-01-31 ENCOUNTER — Ambulatory Visit: Payer: Medicaid Other | Attending: Internal Medicine | Admitting: Internal Medicine

## 2023-01-31 ENCOUNTER — Other Ambulatory Visit: Payer: Self-pay | Admitting: Pharmacist

## 2023-01-31 VITALS — BP 106/74 | HR 91 | Resp 14 | Ht 60.0 in | Wt 209.0 lb

## 2023-01-31 DIAGNOSIS — M05741 Rheumatoid arthritis with rheumatoid factor of right hand without organ or systems involvement: Secondary | ICD-10-CM

## 2023-01-31 DIAGNOSIS — L732 Hidradenitis suppurativa: Secondary | ICD-10-CM | POA: Diagnosis not present

## 2023-01-31 DIAGNOSIS — E559 Vitamin D deficiency, unspecified: Secondary | ICD-10-CM | POA: Diagnosis not present

## 2023-01-31 DIAGNOSIS — Z79899 Other long term (current) drug therapy: Secondary | ICD-10-CM | POA: Diagnosis not present

## 2023-01-31 DIAGNOSIS — M05742 Rheumatoid arthritis with rheumatoid factor of left hand without organ or systems involvement: Secondary | ICD-10-CM

## 2023-01-31 DIAGNOSIS — I7300 Raynaud's syndrome without gangrene: Secondary | ICD-10-CM | POA: Diagnosis not present

## 2023-01-31 DIAGNOSIS — M7918 Myalgia, other site: Secondary | ICD-10-CM

## 2023-01-31 DIAGNOSIS — Z7952 Long term (current) use of systemic steroids: Secondary | ICD-10-CM | POA: Diagnosis not present

## 2023-01-31 NOTE — Progress Notes (Signed)
Pharmacy Note Subjective: Patient presents today to [facility] for follow up office visit. Patient seen by the pharmacist for counseling on Humira for rheumatoid arthritis and hidradenitis suppurativa .  Prior therapy includes: ***.  Diagnosis of heart failure: No  Objective:  CBC    Component Value Date/Time   WBC 5.8 11/30/2022 1134   RBC 4.20 11/30/2022 1134   HGB 12.7 11/30/2022 1134   HGB 13.5 07/02/2013 1634   HCT 39.4 11/30/2022 1134   HCT 40.7 07/02/2013 1634   PLT 364 11/30/2022 1134   PLT 353 07/02/2013 1634   MCV 93.8 11/30/2022 1134   MCV 89 07/02/2013 1634   MCH 30.2 11/30/2022 1134   MCHC 32.2 11/30/2022 1134   RDW 14.7 11/30/2022 1134   RDW 13.1 07/02/2013 1634   LYMPHSABS 1,206 11/30/2022 1134   LYMPHSABS 2.8 07/02/2013 1634   MONOABS 0.8 07/02/2013 1634   EOSABS 110 11/30/2022 1134   EOSABS 0.2 07/02/2013 1634   BASOSABS 17 11/30/2022 1134   BASOSABS 0.1 07/02/2013 1634     CMP     Component Value Date/Time   NA 137 11/30/2022 1134   NA 138 07/02/2013 1634   K 4.9 11/30/2022 1134   K 4.0 07/02/2013 1634   CL 103 11/30/2022 1134   CL 103 07/02/2013 1634   CO2 26 11/30/2022 1134   CO2 28 07/02/2013 1634   GLUCOSE 92 11/30/2022 1134   GLUCOSE 85 07/02/2013 1634   BUN 11 11/30/2022 1134   BUN 12 07/02/2013 1634   CREATININE 0.77 11/30/2022 1134   CALCIUM 9.1 11/30/2022 1134   CALCIUM 9.1 07/02/2013 1634   PROT 7.5 11/30/2022 1134   PROT 8.0 07/02/2013 1634   ALBUMIN 3.5 08/18/2016 1342   ALBUMIN 3.6 07/02/2013 1634   AST 17 11/30/2022 1134   AST 20 07/02/2013 1634   ALT 19 11/30/2022 1134   ALT 27 07/02/2013 1634   ALKPHOS 38 08/18/2016 1342   ALKPHOS 41 (L) 07/02/2013 1634   BILITOT 0.3 11/30/2022 1134   BILITOT 0.4 07/02/2013 1634   GFRNONAA >60 08/18/2016 1342   GFRNONAA >60 07/02/2013 1634   GFRAA >60 08/18/2016 1342   GFRAA >60 07/02/2013 1634      Baseline Immunosuppressant Therapy Labs TB GOLD - updating  today  Hepatitis Panel Hepatitis B surface antigen - non reactive 08/10/2016 (per CareEverywhere) Hepatitis C antibody -  - non reactive 08/10/2016 (per [facility]where) Hepatatis A antibody IgM - non reactive 06/21/2015 (per [facility]Everywhere) Hepatitis B core antibody IgM - non reactive 06/21/2015 (per [facility]Everywhere)  Immunoglobulins   SPEP    Latest Ref Rng & Units 11/30/2022   11:34 AM  Serum Protein Electrophoresis  Total Protein 6.1 - 8.1 g/dL 7.5    Chest x-ray: 05/09/2534 - 1. No evidence of interstitial lung disease. No acute findings are noted in the thorax to account for the patient's symptoms.  Assessment/Plan:  Counseled patient that Humira is a TNF blocking agent.  Counseled patient on purpose, proper use, and adverse effects of Humira.  Reviewed the most common adverse effects including infections, headache, and injection site reactions. Discussed that there is the possibility of an increased risk of malignancy including non-melanoma skin cancer but it is not well understood if this increased risk is due to the medication or the disease state.  Advised patient to get yearly dermatology exams due to risk of skin cancer. Counseled patient that Humira should be held prior to scheduled surgery.  Counseled patient to avoid live vaccines  while on Humira.  Recommend annual influenza, PCV 15 or PCV20 or Pneumovax 23, and Shingrix as indicated.  Reviewed the importance of regular labs while on Humira therapy. Will monitor CBC and CMP 1 month after starting and then every 3 months routinely thereafter. Will monitor TB gold annually. Standing orders placed. Provided patient with medication education material and answered all questions.  Patient consented to Humira.  Will upload consent into the media tab.  Reviewed storage instructions of Humira.  Advised initial injection must be administered in office.  Patient verbalized understanding.   Patient already established with [facility]  Dose will be for rheumatoid arthritis and hidradenitis suppurativa Humira 40 mg every 14 days.  Prescription pending lab results and/or insurance approval.   Chesley Mires, PharmD, MPH, BCPS, CPP Clinical Pharmacist (Rheumatology and Pulmonology)

## 2023-01-31 NOTE — Progress Notes (Signed)
Pending OV note and baseline labs from today, please start Humira BIV  Dx: rheumatoid arthritis and hidradenitis suppurativa Dose: Humira 40 mg every 14 days.  Tried MTX SQ (current), prednisone  Chesley Mires, PharmD, MPH, BCPS, CPP Clinical Pharmacist (Rheumatology and Pulmonology)

## 2023-01-31 NOTE — Patient Instructions (Signed)
Adalimumab Injection What is this medication? ADALIMUMAB (ay da LIM yoo mab) treats autoimmune conditions, such as psoriasis, arthritis, Crohn's disease, and ulcerative colitis. It works by slowing down an overactive immune system. It belongs to a group of medications called TNF inhibitors. It is a monoclonal antibody. This medicine may be used for other purposes; ask your health care provider or pharmacist if you have questions. COMMON BRAND NAME(S): ABRILADA, AMJEVITA, CYLTEZO, HADLIMA, Hulio, Hulio PEN, Humira, HUMIRA PEN, Hyrimoz, Idacio, Simlandi, Yuflyma, YUSIMRY What should I tell my care team before I take this medication? They need to know if you have any of these conditions: Cancer Diabetes (high blood sugar) Having surgery Heart disease Hepatitis B Immune system problems Infections, such as tuberculosis (TB) or other bacterial, fungal, or viral infections Multiple sclerosis Recent or upcoming vaccine An unusual or allergic reaction to adalimumab, mannitol, latex, rubber, other medications, foods, dyes, or preservatives Pregnant or trying to get pregnant Breast-feeding How should I use this medication? This medication is injected under the skin. It may be given by your care team in a hospital or clinic setting. It may also be given at home. If you get this medication at home, you will be taught how to prepare and give it. Use exactly as directed. Take it as directed on the prescription label. Keep taking it unless your care team tells you to stop. This medication comes with INSTRUCTIONS FOR USE. Ask your pharmacist for directions on how to use this medication. Read the information carefully. Talk to your pharmacist or care team if you have questions. It is important that you put your used needles and syringes in a special sharps container. Do not put them in a trash can. If you do not have a sharps container, call your pharmacist or care team to get one. A special MedGuide will be  given to you by the pharmacist with each prescription and refill. If you are getting this medication in a hospital or clinic, a special MedGuide will be given to you before each treatment. Be sure to read this information carefully each time. Talk to your care team about the use of this medication in children. While it be prescribed for children as young as 2 years for selected conditions, precautions do apply. Overdosage: If you think you have taken too much of this medicine contact a poison control center or emergency room at once. NOTE: This medicine is only for you. Do not share this medicine with others. What if I miss a dose? If you get this medication at the hospital or clinic: it is important not to miss your dose. Call your care team if you are unable to keep an appointment. If you give yourself this medication at home: If you miss a dose, take it as soon as you can. If it is almost time for your next dose, take only that dose. Do not take double or extra doses. Call your care team with questions. What may interact with this medication? Do not take this medication with any of the following: Abatacept Anakinra Biologic medications, such as certolizumab, etanercept, golimumab, infliximab Live virus vaccines This medication may also interact with the following: Cyclosporine Theophylline Vaccines Warfarin This list may not describe all possible interactions. Give your health care provider a list of all the medicines, herbs, non-prescription drugs, or dietary supplements you use. Also tell them if you smoke, drink alcohol, or use illegal drugs. Some items may interact with your medicine. What should I watch for while  using this medication? Visit your care team for regular checks on your progress. Tell your care team if your symptoms do not start to get better or if they get worse. You will be tested for tuberculosis (TB) before you start this medication. If your care team prescribes any  medication for TB, you should start taking the TB medication before starting this medication. Make sure to finish the full course of TB medication. This medication may increase your risk of getting an infection. Call your care team for advice if you get a fever, chills, sore throat, or other symptoms of a cold or flu. Do not treat yourself. Try to avoid being around people who are sick. Talk to your care team about your risk of cancer. You may be more at risk for certain types of cancer if you take this medication. What side effects may I notice from receiving this medication? Side effects that you should report to your care team as soon as possible: Allergic reactions--skin rash, itching, hives, swelling of the face, lips, tongue, or throat Aplastic anemia--unusual weakness or fatigue, dizziness, headache, trouble breathing, increased bleeding or bruising Body pain, tingling, or numbness Heart failure--shortness of breath, swelling of the ankles, feet, or hands, sudden weight gain, unusual weakness or fatigue Infection--fever, chills, cough, sore throat, wounds that don't heal, pain or trouble when passing urine, general feeling of discomfort or being unwell Lupus-like syndrome--joint pain, swelling, or stiffness, butterfly-shaped rash on the face, rashes that get worse in the sun, fever, unusual weakness or fatigue Unusual bruising or bleeding Side effects that usually do not require medical attention (report to your care team if they continue or are bothersome): Headache Nausea Pain, redness, or irritation at injection site Runny or stuffy nose Sore throat Stomach pain This list may not describe all possible side effects. Call your doctor for medical advice about side effects. You may report side effects to FDA at 1-800-FDA-1088. Where should I keep my medication? Keep out of the reach of children and pets. Store in the refrigerator. Do not freeze. Keep this medication in the original  packaging until you are ready to take it. Protect from light. Get rid of any unused medication after the expiration date. This medication may be stored at room temperature for up to 14 days. Keep this medication in the original packaging. Protect from light. If it is stored at room temperature, get rid of any unused medication after 14 days or after it expires, whichever is first. To get rid of medications that are no longer needed or have expired: Take the medication to a medication take-back program. Check with your pharmacy or law enforcement to find a location. If you cannot return the medication, ask your pharmacist or care team how to get rid of this medication safely. NOTE: This sheet is a summary. It may not cover all possible information. If you have questions about this medicine, talk to your doctor, pharmacist, or health care provider.  2024 Elsevier/Gold Standard (2021-06-02 00:00:00)

## 2023-02-01 DIAGNOSIS — Z419 Encounter for procedure for purposes other than remedying health state, unspecified: Secondary | ICD-10-CM | POA: Diagnosis not present

## 2023-02-02 LAB — CBC WITH DIFFERENTIAL/PLATELET
Absolute Lymphocytes: 816 {cells}/uL — ABNORMAL LOW (ref 850–3900)
Absolute Monocytes: 740 {cells}/uL (ref 200–950)
Basophils Absolute: 20 {cells}/uL (ref 0–200)
Basophils Relative: 0.4 %
Eosinophils Absolute: 82 {cells}/uL (ref 15–500)
Eosinophils Relative: 1.6 %
HCT: 38.8 % (ref 35.0–45.0)
Hemoglobin: 12.4 g/dL (ref 11.7–15.5)
MCH: 30.8 pg (ref 27.0–33.0)
MCHC: 32 g/dL (ref 32.0–36.0)
MCV: 96.3 fL (ref 80.0–100.0)
MPV: 10 fL (ref 7.5–12.5)
Monocytes Relative: 14.5 %
Neutro Abs: 3443 {cells}/uL (ref 1500–7800)
Neutrophils Relative %: 67.5 %
Platelets: 324 10*3/uL (ref 140–400)
RBC: 4.03 10*6/uL (ref 3.80–5.10)
RDW: 14 % (ref 11.0–15.0)
Total Lymphocyte: 16 %
WBC: 5.1 10*3/uL (ref 3.8–10.8)

## 2023-02-02 LAB — COMPLETE METABOLIC PANEL WITH GFR
AG Ratio: 1.1 (calc) (ref 1.0–2.5)
ALT: 18 U/L (ref 6–29)
AST: 18 U/L (ref 10–35)
Albumin: 3.7 g/dL (ref 3.6–5.1)
Alkaline phosphatase (APISO): 36 U/L (ref 31–125)
BUN: 9 mg/dL (ref 7–25)
CO2: 28 mmol/L (ref 20–32)
Calcium: 8.8 mg/dL (ref 8.6–10.2)
Chloride: 102 mmol/L (ref 98–110)
Creat: 0.74 mg/dL (ref 0.50–0.99)
Globulin: 3.3 g/dL (ref 1.9–3.7)
Glucose, Bld: 90 mg/dL (ref 65–99)
Potassium: 5.1 mmol/L (ref 3.5–5.3)
Sodium: 135 mmol/L (ref 135–146)
Total Bilirubin: 0.3 mg/dL (ref 0.2–1.2)
Total Protein: 7 g/dL (ref 6.1–8.1)
eGFR: 101 mL/min/{1.73_m2} (ref >=60)

## 2023-02-02 LAB — QUANTIFERON-TB GOLD PLUS
Mitogen-NIL: >10 [IU]/mL
NIL: 0.06 [IU]/mL
QuantiFERON-TB Gold Plus: NEGATIVE
TB1-NIL: 0 [IU]/mL
TB2-NIL: 0 [IU]/mL

## 2023-02-02 LAB — VITAMIN D 25 HYDROXY (VIT D DEFICIENCY, FRACTURES): Vit D, 25-Hydroxy: 24 ng/mL — ABNORMAL LOW (ref 30–100)

## 2023-02-02 LAB — SEDIMENTATION RATE: Sed Rate: 29 mm/h — ABNORMAL HIGH (ref 0–20)

## 2023-02-14 DIAGNOSIS — E785 Hyperlipidemia, unspecified: Secondary | ICD-10-CM | POA: Diagnosis not present

## 2023-02-14 DIAGNOSIS — R0789 Other chest pain: Secondary | ICD-10-CM | POA: Diagnosis not present

## 2023-02-14 DIAGNOSIS — E669 Obesity, unspecified: Secondary | ICD-10-CM | POA: Diagnosis not present

## 2023-02-15 MED ORDER — HUMIRA (2 PEN) 40 MG/0.4ML ~~LOC~~ AJKT
40.0000 mg | AUTO-INJECTOR | SUBCUTANEOUS | 0 refills | Status: DC
  Filled 2023-02-18: qty 2, 28d supply, fill #0

## 2023-02-15 NOTE — Progress Notes (Signed)
Received notification from [facility] Medicaid regarding a prior authorization for HUMIRA. Authorization has been APPROVED from 02/01/23 to 02/15/24. Approval letter sent to scan center.  Per test claim, copay for 28 days supply is $4  Patient can fill through [facility]: [phone]   Authorization # [id number]  Patient scheduled for Humira new start on 02/25/23  Chesley Mires, PharmD, MPH, BCPS, CPP Clinical Pharmacist (Rheumatology and Pulmonology)

## 2023-02-15 NOTE — Progress Notes (Signed)
Submitted a Prior Authorization request to [facility] Medicaid for HUMIRA via CoverMyMeds. Will update once we receive a response.  Key: [id number]

## 2023-02-18 ENCOUNTER — Other Ambulatory Visit: Payer: Self-pay

## 2023-02-18 ENCOUNTER — Other Ambulatory Visit (HOSPITAL_COMMUNITY): Payer: Self-pay

## 2023-02-18 NOTE — Progress Notes (Signed)
Specialty Pharmacy Initial Fill Coordination Note  Jeanne Bush is a 47 y.o. female contacted today regarding refills of specialty medication(s) Adalimumab   Patient requested Courier to Provider Office   Delivery date: 02/21/23   Verified address: [address] [place], [place] [address]   Medication will be filled on 02/20/2023.   Patient is aware of $4.00 copayment, credit card info collected and forwarded to Raimon at [facility].

## 2023-02-20 ENCOUNTER — Other Ambulatory Visit (HOSPITAL_COMMUNITY): Payer: Self-pay

## 2023-02-22 NOTE — Patient Instructions (Signed)
Your next HUMIRA dose is due on 03/11/23, 03/25/23, and every 14 days thereafter  CONTINUE methotrexate 20mg  (0.27mL) SQ weekly with folic acid 2mg  daily  HOLD HUMIRA and METHOTREXATE if you have signs or symptoms of an infection. You can resume once you feel better or back to your baseline. HOLD HUMIRA and METHOTREXATE if you start antibiotics to treat an infection. HOLD HUMIRA and METHOTREXATE around the time of surgery/procedures. Your surgeon will be able to provide recommendations on when to hold BEFORE and when you are cleared to RESUME.  Pharmacy information: Your prescription will be shipped from [facility]. Their phone number is [phone] They will call to schedule shipment and confirm address. They will mail your medication to your home.  Labs are due in 1 month then every 3 months. Lab hours are from Monday to Thursday 8am-12:30pm and 1pm-5pm and Friday 8am-12pm. You do not need an appointment if you come for labs during these times. If you'd like to go to a [facility] or [facility] closer to home, please call our clinic 48 hours prior to lab date so we can release orders in a timely manner.  Stay up to date on all routine vaccines: influenza, pneumonia, COVID19, Shingles  How to manage an injection site reaction: Remember the 5 C's: COUNTER - leave on the counter at least 30 minutes but up to overnight to bring medication to room temperature. This may help prevent stinging COLD - place something cold (like an ice gel pack or cold water bottle) on the injection site just before cleansing with alcohol. This may help reduce pain CLARITIN - use Claritin (generic name is loratadine) for the first two weeks of treatment or the day of, the day before, and the day after injecting. This will help to minimize injection site reactions CORTISONE CREAM - apply if injection site is irritated and itching CALL ME - if injection site reaction is bigger than the size of your fist,  looks infected, blisters, or if you develop hives

## 2023-02-22 NOTE — Progress Notes (Unsigned)
Pharmacy Note  Subjective:   Patient presents to clinic today to receive first dose of Humira for RA. Patient currently takes methotrexate 20mg  (0.26mL) SQ weekly with folic acid 2mg  daily.  Patient running a fever or have signs/symptoms of infection? No  Patient currently on antibiotics for the treatment of infection? No  Patient have any upcoming invasive procedures/surgeries? No  Objective: CMP     Component Value Date/Time   NA 135 01/31/2023 0833   NA 138 07/02/2013 1634   K 5.1 01/31/2023 0833   K 4.0 07/02/2013 1634   CL 102 01/31/2023 0833   CL 103 07/02/2013 1634   CO2 28 01/31/2023 0833   CO2 28 07/02/2013 1634   GLUCOSE 90 01/31/2023 0833   GLUCOSE 85 07/02/2013 1634   BUN 9 01/31/2023 0833   BUN 12 07/02/2013 1634   CREATININE 0.74 01/31/2023 0833   CALCIUM 8.8 01/31/2023 0833   CALCIUM 9.1 07/02/2013 1634   PROT 7.0 01/31/2023 0833   PROT 8.0 07/02/2013 1634   ALBUMIN 3.5 08/18/2016 1342   ALBUMIN 3.6 07/02/2013 1634   AST 18 01/31/2023 0833   AST 20 07/02/2013 1634   ALT 18 01/31/2023 0833   ALT 27 07/02/2013 1634   ALKPHOS 38 08/18/2016 1342   ALKPHOS 41 (L) 07/02/2013 1634   BILITOT 0.3 01/31/2023 0833   BILITOT 0.4 07/02/2013 1634   GFRNONAA >60 08/18/2016 1342   GFRNONAA >60 07/02/2013 1634   GFRAA >60 08/18/2016 1342   GFRAA >60 07/02/2013 1634    CBC    Component Value Date/Time   WBC 5.1 01/31/2023 0833   RBC 4.03 01/31/2023 0833   HGB 12.4 01/31/2023 0833   HGB 13.5 07/02/2013 1634   HCT 38.8 01/31/2023 0833   HCT 40.7 07/02/2013 1634   PLT 324 01/31/2023 0833   PLT 353 07/02/2013 1634   MCV 96.3 01/31/2023 0833   MCV 89 07/02/2013 1634   MCH 30.8 01/31/2023 0833   MCHC 32.0 01/31/2023 0833   RDW 14.0 01/31/2023 0833   RDW 13.1 07/02/2013 1634   LYMPHSABS 1,206 11/30/2022 1134   LYMPHSABS 2.8 07/02/2013 1634   MONOABS 0.8 07/02/2013 1634   EOSABS 82 01/31/2023 0833   EOSABS 0.2 07/02/2013 1634   BASOSABS 20 01/31/2023 0833    BASOSABS 0.1 07/02/2013 1634    Baseline Immunosuppressant Therapy Labs TB GOLD    Latest Ref Rng & Units 01/31/2023    8:33 AM  Quantiferon TB Gold  Quantiferon TB Gold Plus NEGATIVE NEGATIVE    Hepatitis Panel Hepatitis B surface antigen - non reactive 08/10/2016 (per CareEverywhere) Hepatitis C antibody -  - non reactive 08/10/2016 (per CareEverywhere) Hepatatis A antibody IgM - non reactive 06/21/2015 (per CareEverywhere) Hepatitis B core antibody IgM - non reactive 06/21/2015 (per CareEverywhere)  SPEP    Latest Ref Rng & Units 01/31/2023    8:33 AM  Serum Protein Electrophoresis  Total Protein 6.1 - 8.1 g/dL 7.0    Chest x-ray: 0/09/3014 - 1. No evidence of interstitial lung disease. No acute findings are noted in the thorax   Assessment/Plan:  Reviewed importance of holding Humira and methotrexate with signs/symptoms of an infections, if antibiotics are prescribed to treat an active infection, and with invasive procedures  Demonstrated proper injection technique with Humira demo device  Patient able to demonstrate proper injection technique using the teach back method.  Patient self injected in the left lower abdomen with:  Pharmacy-supplied Medication: Humira 40mg /0.13mL pen injector NDC: [address]-73 Lot: [id number] Expiration:  07/2024  Patient tolerated well.  Observed for 30 mins in office for adverse reaction. Patient denies itchiness and irritation at injection., No swelling or redness noted., and Reviewed injection site reaction management with patient verbally and printed information for review in AVS  Patient is to return in 1 month for labs and 6-8 weeks for follow-up appointment.  Standing orders for CBC/CMP placed.  TB gold will be monitored yearly. Patient already established with [facility] for routine skin examinations.  Humira approved through insurance .   Rx sent to: [facility]: [phone] .  Patient provided with pharmacy phone  number and advised to call later this week to schedule shipment to home.  Patient will continue Humira 40mg  subcut every 14 days in combination with methotrexate 20mg  (0.57mL) SQ weekly .  All questions encouraged and answered.  Instructed patient to call with any further questions or concerns.  Chesley Mires, PharmD, MPH, BCPS, CPP Clinical Pharmacist (Rheumatology and Pulmonology)  02/22/2023 3:03 PM

## 2023-02-25 ENCOUNTER — Other Ambulatory Visit (HOSPITAL_COMMUNITY): Payer: Self-pay

## 2023-02-25 ENCOUNTER — Other Ambulatory Visit: Payer: Self-pay

## 2023-02-25 ENCOUNTER — Ambulatory Visit: Payer: Medicaid Other | Attending: Internal Medicine | Admitting: Pharmacist

## 2023-02-25 DIAGNOSIS — Z79899 Other long term (current) drug therapy: Secondary | ICD-10-CM

## 2023-02-25 DIAGNOSIS — Z7189 Other specified counseling: Secondary | ICD-10-CM

## 2023-02-25 DIAGNOSIS — M05741 Rheumatoid arthritis with rheumatoid factor of right hand without organ or systems involvement: Secondary | ICD-10-CM

## 2023-02-25 MED ORDER — HUMIRA (2 PEN) 40 MG/0.4ML ~~LOC~~ AJKT
40.0000 mg | AUTO-INJECTOR | SUBCUTANEOUS | 1 refills | Status: DC
  Filled 2023-02-25 – 2023-03-13 (×2): qty 2, 28d supply, fill #0

## 2023-02-25 NOTE — Progress Notes (Signed)
Patient completed first Humira dose in clinic on 02/25/23. Tolerated quite well. Her husband helps her with MTX injections.  Will continue Humira 40mg  SQ every 14 days in combination with methotrexate 20mg  (0.60mL) SQ weekly with folic acid 2mg  daily  Chesley Mires, PharmD, MPH, BCPS, CPP Clinical Pharmacist (Rheumatology and Pulmonology)

## 2023-03-07 ENCOUNTER — Other Ambulatory Visit: Payer: Self-pay

## 2023-03-12 ENCOUNTER — Other Ambulatory Visit
Admission: RE | Admit: 2023-03-12 | Discharge: 2023-03-12 | Disposition: A | Discharge status: Home / Self Care | Payer: Medicaid Other | Source: Ambulatory Visit | Attending: Internal Medicine | Admitting: Internal Medicine

## 2023-03-12 DIAGNOSIS — M05741 Rheumatoid arthritis with rheumatoid factor of right hand without organ or systems involvement: Secondary | ICD-10-CM | POA: Insufficient documentation

## 2023-03-12 DIAGNOSIS — M05742 Rheumatoid arthritis with rheumatoid factor of left hand without organ or systems involvement: Secondary | ICD-10-CM | POA: Diagnosis present

## 2023-03-12 DIAGNOSIS — Z79899 Other long term (current) drug therapy: Secondary | ICD-10-CM | POA: Insufficient documentation

## 2023-03-12 LAB — CBC WITH DIFFERENTIAL/PLATELET
Abs Immature Granulocytes: 0.02 10*3/uL (ref 0.00–0.07)
Basophils Absolute: 0 10*3/uL (ref 0.0–0.1)
Basophils Relative: 0 %
Eosinophils Absolute: 0 10*3/uL (ref 0.0–0.5)
Eosinophils Relative: 0 %
HCT: 36.9 % (ref 36.0–46.0)
Hemoglobin: 12.5 g/dL (ref 12.0–15.0)
Immature Granulocytes: 0 %
Lymphocytes Relative: 23 %
Lymphs Abs: 1.1 10*3/uL (ref 0.7–4.0)
MCH: 31.6 pg (ref 26.0–34.0)
MCHC: 33.9 g/dL (ref 30.0–36.0)
MCV: 93.2 fL (ref 80.0–100.0)
Monocytes Absolute: 0.4 10*3/uL (ref 0.1–1.0)
Monocytes Relative: 8 %
Neutro Abs: 3.2 10*3/uL (ref 1.7–7.7)
Neutrophils Relative %: 69 %
Platelets: 310 10*3/uL (ref 150–400)
RBC: 3.96 MIL/uL (ref 3.87–5.11)
RDW: 13.7 % (ref 11.5–15.5)
WBC: 4.7 10*3/uL (ref 4.0–10.5)
nRBC: 0 % (ref 0.0–0.2)

## 2023-03-12 LAB — COMPREHENSIVE METABOLIC PANEL
ALT: 36 U/L (ref 0–44)
AST: 34 U/L (ref 15–41)
Albumin: 3.5 g/dL (ref 3.5–5.0)
Alkaline Phosphatase: 25 U/L — ABNORMAL LOW (ref 38–126)
Anion gap: 9 (ref 5–15)
BUN: 10 mg/dL (ref 6–20)
CO2: 22 mmol/L (ref 22–32)
Calcium: 8.7 mg/dL — ABNORMAL LOW (ref 8.9–10.3)
Chloride: 102 mmol/L (ref 98–111)
Creatinine, Ser: 0.72 mg/dL (ref 0.44–1.00)
GFR, Estimated: >60 mL/min (ref >60)
Glucose, Bld: 105 mg/dL — ABNORMAL HIGH (ref 70–99)
Potassium: 3.5 mmol/L (ref 3.5–5.1)
Sodium: 133 mmol/L — ABNORMAL LOW (ref 135–145)
Total Bilirubin: 0.7 mg/dL (ref <1.2)
Total Protein: 7.6 g/dL (ref 6.5–8.1)

## 2023-03-12 NOTE — Addendum Note (Signed)
Addended by: Lonell Face C on: 03/12/2023 02:45 PM   Modules accepted: Orders

## 2023-03-13 ENCOUNTER — Other Ambulatory Visit: Payer: Self-pay

## 2023-03-13 NOTE — Progress Notes (Signed)
Specialty Pharmacy Refill Coordination Note  Jeanne Bush is a 47 y.o. female contacted today regarding refills of specialty medication(s) Adalimumab   Patient requested Delivery   Delivery date: 03/19/23   Verified address: [address] [name] [address]   Medication will be filled on 03/18/23.

## 2023-03-18 ENCOUNTER — Other Ambulatory Visit (HOSPITAL_COMMUNITY): Payer: Self-pay

## 2023-03-18 ENCOUNTER — Other Ambulatory Visit: Payer: Self-pay | Admitting: Pharmacist

## 2023-03-18 ENCOUNTER — Other Ambulatory Visit: Payer: Self-pay

## 2023-03-18 DIAGNOSIS — Z79899 Other long term (current) drug therapy: Secondary | ICD-10-CM

## 2023-03-18 DIAGNOSIS — M05741 Rheumatoid arthritis with rheumatoid factor of right hand without organ or systems involvement: Secondary | ICD-10-CM

## 2023-03-18 MED ORDER — HUMIRA (2 PEN) 40 MG/0.4ML ~~LOC~~ AJKT
40.0000 mg | AUTO-INJECTOR | SUBCUTANEOUS | 0 refills | Status: DC
  Filled 2023-03-18 – 2023-04-29 (×3): qty 2, 28d supply, fill #0

## 2023-03-18 NOTE — Progress Notes (Signed)
Received notification from [facility] that rx for Humira needs to be sent in orders only encounter for 340B pricing.

## 2023-03-22 ENCOUNTER — Other Ambulatory Visit: Payer: Self-pay | Admitting: Internal Medicine

## 2023-03-22 DIAGNOSIS — M351 Other overlap syndromes: Secondary | ICD-10-CM

## 2023-03-22 NOTE — Telephone Encounter (Signed)
Last Fill: 06/25/2022  Next Visit: 04/09/2023  Last Visit: 01/31/2023  Dx: Rheumatoid arthritis involving both hands with positive rheumatoid factor   Current Dose per office note on 01/31/2023: for methotrexate  Okay to refill Syringes?

## 2023-03-28 NOTE — Progress Notes (Signed)
Office Visit Note  Patient: Jeanne Bush             Date of Birth: 04-Feb-1976           MRN: [id number]             PCP: Araceli Bouche, DO Referring: Araceli Bouche, DO Visit Date: 04/09/2023   Subjective:  Follow-up   Discussed the use of AI scribe software for clinical note transcription with the patient, who gave verbal consent to proceed.  History of Present Illness   Jeanne Bush is a 47 y.o. female here for follow up with mixed connective tissue disease/RA overlap on methotrexate 20 mg subcu weekly, folic acid 1 mg daily, and adalimumab 40 mg subcu q. 14 days. She has been on Humira for approximately two months now. They report no significant improvement in joint pain or skin rashes, but note that the lesion on the buttocks appears to be scabbing over.  The patient also reports a persistent cluster headache, which began shortly after the initiation of Humira. The headache is characterized by a sensation of pressure at the back of the eyes and a kaleidoscope effect on the periphery of their vision. This has been ongoing for about three weeks, which is significantly longer than their usual headache duration of one to two days. The patient denies any associated pain, palpitations, or sinus symptoms.  The patient is also on prednisone, methotrexate, and a muscle relaxer. They have noticed some weight gain since their last visit, which they attribute to the prednisone. They are also in the process of switching insurance due to approval of disability, and plan to see an optometrist or ophthalmologist once this is finalized.  The patient denies any major flare-ups or swollen areas since the last visit. They report that joint pain has either been normal or slightly improved, but muscle pain persists. They also mention a persistent rash in the groin area.    Previous HPI 01/31/2023 Jeanne Bush is a 47 y.o. female here for follow up with mixed connective tissue disease/RA  overlap on methotrexate 20 mg subcu weekly and folic acid 1 mg daily.  Since our last visit joint pain and stiffness is doing pretty well but remains on 5 mg prednisone daily.  She was able to see dermatology with [facility] for skin biopsy that confirmed hidradenitis.  Stillness we discussed previous recommendation for biologic DMARD treatment here to discuss medication and needs baseline laboratory testing.   Previous HPI 11/30/2022 Jeanne Bush is a 46 y.o. female here for follow up with mixed connective tissue disease/RA overlap on methotrexate 20 mg subcu weekly and folic acid 1 mg daily.  After finishing the previous prednisone course developed a significant increase in joint pain and stiffness and muscular pain throughout upper and lower extremities.  Especially sore in her fingers and wrists.  Resuming prednisone at 5 mg daily helped a lot with these musculoskeletal symptoms.  Still having issues with chronic fatigue and brain fog type concentration difficulty.  She is having some more indigestion due to stopping her omeprazole at recommendations of the pharmacist with starting methotrexate.  Also found to have a rash on her buttocks and gluteal region is recommended to see dermatology for suspicion of possible hidradenitis suppurativa.  No previous history of similar chronic skin lesions no involvement in the axilla.   Previous HPI 08/29/2022 Jeanne Bush is a 47 y.o. female here for follow up with mixed connective tissue disease/RA overlap after resuming methotrexate 20  mg subcu weekly and folic acid 1 mg daily since our visit in March.  She did not notice any appreciable side effect while resuming methotrexate but is also still having a good amount of symptoms with pain and stiffness and fatigue.  For about the past 1 week he has significant increase in pain especially throughout her chest and back.  This is most noticeable when trying to roll over in bed or with any direct pressure on these areas.  Has  not experienced any new peripheral joint swelling.  She is also having more trouble with acid reflux after discontinuing omeprazole at her pharmacist recommendation since starting methotrexate.  This drug was originally recommended based on her ENT with what sounds like laryngal pharyngeal reflux symptoms. Does not also complaining of feeling excessively dry all the time despite drinking a lot of water daily and frequent urination.  Not specifically dryness of the eyes or mouth although she does get benefit using the eyedrops.   Previous HPI 06/25/22 Jeanne Bush is a 47 y.o. 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 Marland Mcalpine at [facility]. She was on subcutaneous methotrexate treatment and cymbalta, lyrica, and voltaren for symptoms due to arthritis and the fibromyalgia. Labs positive for ANA, RNP, and RF. She has had some shortness of breath and pulmonary studies indicative for small airways disease with no pulmonary fibrosis on previous CT chest exam. History of eczema treated with cellcept and methotrexate, had previous biopsy for this in 2014. Hydroxychloroquine was stopped due to increasing dizziness symptoms.  She has now been off methotrexate due to lack of clinical follow-up after she lost the insurance.  Off the medication has experienced some increase in joint pain and stiffness as well as eczematous rashes. She experiences some swelling as well as pain and stiffness in both hands.  Had carpal tunnel release surgery last year without any great relief of symptoms and is still getting problems with the hand and wrist pains and sometimes numbness.  Does not see much swelling in her other joints.  She pretty often experiences pain across the middle and upper chest region this had been suspected as pleurisy by some previous providers no particular diagnostic testing or  findings associated with this. She has longstanding history of Raynaud's symptoms not associated with any ulcers or digital pitting. Eczema skin rashes on the chest are new since.  Previously had axillary skin lesions questionable for hidradenitis suppurativa but these pretty much resolved with stopping shaving. She originally had onset of chronic joint pain and sensitivity since she was a teenager.  The symptoms were attributed as growing pains but the generalized pain and sensitivity she has now remained similar to what she experienced at that time.  She gets sensitivity to touch pretty much all over on a daily basis.  Also with persistent brain fog and fatigue.  She has chronic mild constipation symptoms but without any serious GI complications.       Labs reviewed 2019 SSA pos U1RNP >150   07/2016 ANA pos RNP pos RF 36 HCV neg HBV neg   02/25/19 HRCT Chest w/o Contrast No pulmonary fibrosis   Review of Systems  Constitutional:  Positive for fatigue.  HENT:  Negative for mouth sores and mouth dryness.   Eyes:  Positive for dryness.  Respiratory:  Positive for shortness of breath.   Cardiovascular:  Positive for chest pain and palpitations.  Gastrointestinal:  Negative for blood in stool, constipation and diarrhea.  Endocrine: Negative for increased urination.  Genitourinary:  Negative for involuntary urination.  Musculoskeletal:  Positive for joint pain, joint pain, joint swelling, myalgias, muscle weakness, morning stiffness, muscle tenderness and myalgias. Negative for gait problem.  Skin:  Negative for color change, rash, hair loss and sensitivity to sunlight.  Allergic/Immunologic: Negative for susceptible to infections.  Neurological:  Positive for headaches. Negative for dizziness.  Hematological:  Negative for swollen glands.  Psychiatric/Behavioral:  Negative for depressed mood and sleep disturbance. The patient is not nervous/anxious.     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   Mixed connective tissue disease (HCC) 03/08/2022   Raynaud's disease without gangrene 03/08/2022   Myopia of both eyes with astigmatism 01/15/2017   Plaquenil causing adverse effect in therapeutic use 01/15/2017   Rheumatoid arthritis involving both hands with positive rheumatoid factor (HCC) 10/12/2016   Allergic rhinitis 08/10/2013   Gastro-esophageal reflux disease without esophagitis 06/15/2013   Hypothyroidism 06/15/2013   Atopic dermatitis 06/15/2013   Obesity 06/15/2013   Plantar fascial fibromatosis 06/15/2013   Pure hyperglyceridemia 06/15/2013   Palpitations 01/07/2013   Sinus tachycardia 01/06/2013   Low back pain 11/18/2012    Past Medical History:  Diagnosis Date   Hyperthyroidism    Mixed connective tissue disease (HCC)    Rheumatoid arthritis (HCC)    Tachycardia     Family History  Problem Relation Age of Onset   Thyroid disease Mother    Arthritis Father    Atrial fibrillation Father    Congestive Heart Failure Father    Breast cancer Maternal Aunt    Past Surgical History:  Procedure Laterality Date   BILATERAL CARPAL TUNNEL RELEASE Bilateral    CESAREAN SECTION     CESAREAN SECTION     TUBAL LIGATION     Social History   Social History Narrative   Not on file   Immunization History  Administered Date(s) Administered   Influenza,inj,Quad PF,6+ Mos 01/07/2013, 01/31/2017, 01/15/2019, 04/07/2020, 03/08/2022   Influenza-Unspecified 02/13/2017   Pneumococcal Conjugate-13 06/11/2017   Tdap 01/18/2005, 08/10/2015     Objective: Vital Signs: BP 120/83 (BP Location: Left Arm, Patient Position: Sitting, Cuff Size: Normal)   Pulse 84   Resp 14    Ht 5\' 1"  (1.549 m)   Wt 216 lb (98 kg)   LMP 03/22/2023   BMI 40.81 kg/m    Physical Exam Eyes:     Conjunctiva/sclera: Conjunctivae normal.  Cardiovascular:     Rate and Rhythm: Normal rate and regular rhythm.  Pulmonary:     Effort: Pulmonary effort is normal.     Breath sounds: Normal breath sounds.  Lymphadenopathy:     Cervical: No cervical adenopathy.  Skin:    General: Skin is warm and dry.  Neurological:     Mental Status: She is alert.  Psychiatric:        Mood and Affect: Mood normal.      Musculoskeletal Exam:  Shoulders full ROM no tenderness or swelling Upper and lower back tenderness to pressure over spine and muscles, no radiation Elbows full ROM no tenderness or swelling Wrists full ROM no tenderness or swelling Fingers full ROM  no tenderness or swelling Knees full ROM no tenderness or swelling   Investigation: No additional findings.  Imaging: No results found.  Recent Labs: Lab Results  Component Value Date   WBC 4.7 03/12/2023   HGB 12.5 03/12/2023   PLT 310 03/12/2023   NA 133 (L) 03/12/2023   K 3.5 03/12/2023   CL 102 03/12/2023   CO2 22 03/12/2023   GLUCOSE 105 (H) 03/12/2023   BUN 10 03/12/2023   CREATININE 0.72 03/12/2023   BILITOT 0.7 03/12/2023   ALKPHOS 25 (L) 03/12/2023   AST 34 03/12/2023   ALT 36 03/12/2023   PROT 7.6 03/12/2023   ALBUMIN 3.5 03/12/2023   CALCIUM 8.7 (L) 03/12/2023   GFRAA >60 08/18/2016   QFTBGOLDPLUS NEGATIVE 01/31/2023    Speciality Comments: Humira started 02/25/23  Procedures:  No procedures performed Allergies: Hydroxychloroquine   Assessment / Plan:     Visit Diagnoses: Rheumatoid arthritis involving both hands with positive rheumatoid factor (HCC) - Plan: Sedimentation rate No significant change in joint symptoms since starting Humira. -Continue Humira 40 mg [place] q14days, Methotrexate 20 mg [place] weekly, and Prednisone 5mg  daily. -Check inflammatory markers to assess response to treatment, if  good can try decreasing methotrexate dose  High risk medication use - methotrexate 20 mg subcu weekly, adalimumab 40 mg subcu q. 14 days. Reviewed recent labs including blood count metabolic panel no evidence of cytopenias no change in liver function.  No serious interval infections.  Seems to be tolerating medicines except possibly if the headaches are related to Humira.  Long term (current) use of systemic steroids - prednisone 5 mg daily  Raynaud's disease without gangrene  Myofascial pain - On Lyrica and Cymbalta for this already - Plan: cyclobenzaprine (FLEXERIL) 5 MG tablet  Cluster Headaches No associated pain or sinus symptoms. No clear correlation with Humira administration but did start after medication start overall. -Continue current treatment regimen. -If no improvement after another month will try holding humira to see if it resolves -Consider ophthalmology consultation if symptoms persist or worsen.  Skin Rash Improvement noted in skin lesion on buttocks, possibly due to Humira. -Continue Humira as prescribed.   Orders: Orders Placed This Encounter  Procedures   Sedimentation rate   Meds ordered this encounter  Medications   cyclobenzaprine (FLEXERIL) 5 MG tablet    Sig: Take 1 tablet (5 mg total) by mouth at bedtime as needed for muscle spasms.    Dispense:  90 tablet    Refill:  0     Follow-Up Instructions: Return in about 3 months (around 07/08/2023) for RA/HS on MTX/GC/ADA f/u 3mos.   Fuller Plan, MD  Note - This record has been created using [employer].  Chart creation errors have been sought, but may not always  have been located. Such creation errors do not reflect on  the standard of medical care.

## 2023-04-02 ENCOUNTER — Encounter: Payer: Self-pay | Admitting: Internal Medicine

## 2023-04-09 ENCOUNTER — Encounter: Payer: Self-pay | Admitting: Internal Medicine

## 2023-04-09 ENCOUNTER — Ambulatory Visit: Payer: Medicare Other | Attending: Internal Medicine | Admitting: Internal Medicine

## 2023-04-09 VITALS — BP 120/83 | HR 84 | Resp 14 | Ht 61.0 in | Wt 216.0 lb

## 2023-04-09 DIAGNOSIS — M05741 Rheumatoid arthritis with rheumatoid factor of right hand without organ or systems involvement: Secondary | ICD-10-CM | POA: Insufficient documentation

## 2023-04-09 DIAGNOSIS — M7918 Myalgia, other site: Secondary | ICD-10-CM | POA: Insufficient documentation

## 2023-04-09 DIAGNOSIS — Z7952 Long term (current) use of systemic steroids: Secondary | ICD-10-CM | POA: Diagnosis present

## 2023-04-09 DIAGNOSIS — M05742 Rheumatoid arthritis with rheumatoid factor of left hand without organ or systems involvement: Secondary | ICD-10-CM | POA: Insufficient documentation

## 2023-04-09 DIAGNOSIS — E559 Vitamin D deficiency, unspecified: Secondary | ICD-10-CM | POA: Diagnosis present

## 2023-04-09 DIAGNOSIS — Z79899 Other long term (current) drug therapy: Secondary | ICD-10-CM | POA: Diagnosis present

## 2023-04-09 DIAGNOSIS — I7300 Raynaud's syndrome without gangrene: Secondary | ICD-10-CM | POA: Diagnosis present

## 2023-04-09 MED ORDER — CYCLOBENZAPRINE HCL 5 MG PO TABS: 5.0000 mg | ORAL_TABLET | Freq: Every evening | ORAL | 0 refills | Status: DC | PRN

## 2023-04-11 ENCOUNTER — Other Ambulatory Visit
Admission: RE | Admit: 2023-04-11 | Discharge: 2023-04-11 | Disposition: A | Discharge status: Home / Self Care | Payer: Medicare Other | Attending: Internal Medicine | Admitting: Internal Medicine

## 2023-04-11 DIAGNOSIS — M05742 Rheumatoid arthritis with rheumatoid factor of left hand without organ or systems involvement: Secondary | ICD-10-CM | POA: Diagnosis not present

## 2023-04-11 DIAGNOSIS — M05741 Rheumatoid arthritis with rheumatoid factor of right hand without organ or systems involvement: Secondary | ICD-10-CM | POA: Diagnosis present

## 2023-04-11 LAB — SEDIMENTATION RATE: Sed Rate: 33 mm/h — ABNORMAL HIGH (ref 0–20)

## 2023-04-12 ENCOUNTER — Other Ambulatory Visit: Payer: Self-pay

## 2023-04-12 ENCOUNTER — Other Ambulatory Visit (HOSPITAL_COMMUNITY): Payer: Self-pay

## 2023-04-12 NOTE — Progress Notes (Signed)
 Specialty Pharmacy Refill Coordination Note  Jeanne Bush is a 48 y.o. female contacted today regarding refills of specialty medication(s) Adalimumab  (Humira  (2 Pen))   Patient requested Delivery   Delivery date: 04/16/23   Verified address: [address]   [name] [id number]   Medication will be filled on 04/15/23.

## 2023-04-15 ENCOUNTER — Other Ambulatory Visit (HOSPITAL_COMMUNITY): Payer: Self-pay

## 2023-04-16 ENCOUNTER — Other Ambulatory Visit (HOSPITAL_COMMUNITY): Payer: Self-pay

## 2023-04-17 ENCOUNTER — Other Ambulatory Visit (HOSPITAL_COMMUNITY): Payer: Self-pay

## 2023-04-18 ENCOUNTER — Other Ambulatory Visit (HOSPITAL_COMMUNITY): Payer: Self-pay

## 2023-04-19 ENCOUNTER — Other Ambulatory Visit: Payer: Self-pay

## 2023-04-19 ENCOUNTER — Other Ambulatory Visit (HOSPITAL_COMMUNITY): Payer: Self-pay

## 2023-04-22 ENCOUNTER — Other Ambulatory Visit: Payer: Self-pay

## 2023-04-23 ENCOUNTER — Other Ambulatory Visit: Payer: Self-pay

## 2023-04-24 ENCOUNTER — Other Ambulatory Visit: Payer: Self-pay

## 2023-04-25 ENCOUNTER — Other Ambulatory Visit: Payer: Self-pay

## 2023-04-26 ENCOUNTER — Other Ambulatory Visit: Payer: Self-pay

## 2023-04-26 NOTE — Progress Notes (Signed)
04/26/23 CMA: Humira  Coverage terminated as of 04/15/23. Mills Koller has been notified. No active insurance via eligibility as of today's date.

## 2023-04-29 ENCOUNTER — Other Ambulatory Visit: Payer: Self-pay

## 2023-04-29 ENCOUNTER — Telehealth: Payer: Self-pay

## 2023-04-29 ENCOUNTER — Other Ambulatory Visit (HOSPITAL_COMMUNITY): Payer: Self-pay

## 2023-04-29 NOTE — Progress Notes (Signed)
Insurance updated and PA approved. Mail 1.28 - patient is aware.

## 2023-04-29 NOTE — Telephone Encounter (Signed)
Received notification from [facility] pharmacy that patient requires a new authorization for their medication. It appears that her primary insurance is no longer active and pt may only have a Medicaid plan at this time.  Submitted an URGENT Prior Authorization request to [facility] MEDICAID for HUMIRA via [employer]. Will update once we receive a response.  Confirmation #: [id number]

## 2023-04-29 NOTE — Telephone Encounter (Signed)
Received notification from [facility] MEDICAID regarding a prior authorization for HUMIRA. Authorization has been APPROVED from 04/29/2023 to 04/28/2024.  There will likely not be an approval letter to document this.  Authorization # [id number]  Shavod at [facility] has been notified and claim has successfully adjudicated. Nothing further needed at this time.

## 2023-05-16 ENCOUNTER — Other Ambulatory Visit: Payer: Self-pay | Admitting: Ophthalmology

## 2023-05-16 DIAGNOSIS — G43109 Migraine with aura, not intractable, without status migrainosus: Secondary | ICD-10-CM

## 2023-05-17 ENCOUNTER — Other Ambulatory Visit: Payer: Self-pay

## 2023-05-17 ENCOUNTER — Other Ambulatory Visit: Payer: Self-pay | Admitting: Internal Medicine

## 2023-05-17 DIAGNOSIS — Z79899 Other long term (current) drug therapy: Secondary | ICD-10-CM

## 2023-05-17 DIAGNOSIS — M05741 Rheumatoid arthritis with rheumatoid factor of right hand without organ or systems involvement: Secondary | ICD-10-CM

## 2023-05-17 NOTE — Telephone Encounter (Signed)
Last Fill: 03/18/2023  Labs: 03/12/2023 Sodium 133, Glucose 105, Calcium 8.7, Alk. Phos. 25  TB Gold: 01/31/2023 Neg   Next Visit: 07/08/2023  Last Visit: 04/09/2023  DX: Rheumatoid arthritis involving both hands with positive rheumatoid factor    Current Dose per office note 04/09/2023:  Humira 40 mg [place] q14days   Okay to refill Humira?

## 2023-05-17 NOTE — Progress Notes (Signed)
Specialty Pharmacy Refill Coordination Note  Jeanne Bush is a 48 y.o. female contacted today regarding refills of specialty medication(s) Adalimumab (Humira (2 Pen))   Patient requested Delivery   Delivery date: 05/31/23   Verified address: [address]   [name] [address]   Medication will be filled on 02.27.25.     This fill date is pending response to refill request from provider. Patient is aware and if they have not received fill by intended date they must follow up with pharmacy.

## 2023-05-20 ENCOUNTER — Other Ambulatory Visit: Payer: Self-pay

## 2023-05-20 ENCOUNTER — Other Ambulatory Visit (HOSPITAL_COMMUNITY): Payer: Self-pay

## 2023-05-20 MED ORDER — HUMIRA (2 PEN) 40 MG/0.4ML ~~LOC~~ AJKT
40.0000 mg | AUTO-INJECTOR | SUBCUTANEOUS | 2 refills | Status: DC
  Filled 2023-05-20: qty 2, 28d supply, fill #0
  Filled 2023-06-27: qty 2, 28d supply, fill #1

## 2023-05-21 ENCOUNTER — Ambulatory Visit
Admission: RE | Admit: 2023-05-21 | Discharge: 2023-05-21 | Disposition: A | Discharge status: Home / Self Care | Payer: Medicare Other | Source: Ambulatory Visit | Attending: Ophthalmology | Admitting: Ophthalmology

## 2023-05-21 DIAGNOSIS — G43109 Migraine with aura, not intractable, without status migrainosus: Secondary | ICD-10-CM | POA: Insufficient documentation

## 2023-05-21 MED ORDER — GADOBUTROL 1 MMOL/ML IV SOLN
10.0000 mL | Freq: Once | INTRAVENOUS | Status: AC | PRN
  Administered 2023-05-21: 10 mL via INTRAVENOUS

## 2023-05-22 ENCOUNTER — Encounter: Payer: Self-pay | Admitting: Internal Medicine

## 2023-05-22 ENCOUNTER — Ambulatory Visit: Payer: Medicare Other | Admitting: Student in an Organized Health Care Education/Training Program

## 2023-05-27 ENCOUNTER — Encounter: Payer: Self-pay | Admitting: Neurology

## 2023-05-29 ENCOUNTER — Encounter: Payer: Self-pay | Admitting: Student in an Organized Health Care Education/Training Program

## 2023-05-29 ENCOUNTER — Ambulatory Visit (INDEPENDENT_AMBULATORY_CARE_PROVIDER_SITE_OTHER): Payer: Medicare Other | Admitting: Student in an Organized Health Care Education/Training Program

## 2023-05-29 VITALS — BP 106/76 | HR 81 | Temp 97.6°F | Ht 61.0 in | Wt 209.0 lb

## 2023-05-29 DIAGNOSIS — J4540 Moderate persistent asthma, uncomplicated: Secondary | ICD-10-CM | POA: Diagnosis not present

## 2023-05-29 DIAGNOSIS — M351 Other overlap syndromes: Secondary | ICD-10-CM | POA: Diagnosis not present

## 2023-05-29 NOTE — Progress Notes (Signed)
 Assessment & Plan:   #Mild Intermittent Asthma #Mixed Connective Tissue Disease  Presents for follow up in the setting of history of mixed connective tissue disease (RA/SLE) currently followed by Dr. Dimple Casey from rheumatology and maintained on Adalimumab, Methotrexate, and low dose prednisone.  She's had previous HRCT's at [facility] that did not show any fibrosis, and previous PFT's were normal. Both were repeated last year here and again no sign of ILD or of abnormality of PFT aside from small response to bronchodilator challenge, currently treated with ICS/LABA for Asthma with improvement in symptoms. She has had positive ANA, RF, and RNP antibodies and is at high risk for the development of interstitial lung disease. We will monitor with PFT's for now, and consider repeat CT with change in DLCO or in physical exam. Will repeat PFT's in one year after follow up. Will continue with Advair for now.  -PFT's in one year (will order on follow up visit) -Continue Advair  Return in about 1 year (around 05/28/2024).  I spent 30 minutes caring for this patient today, including preparing to see the patient, obtaining a medical history , reviewing a separately obtained history, performing a medically appropriate examination and/or evaluation, counseling and educating the patient/family/caregiver, ordering medications, tests, or procedures, and documenting clinical information in the electronic health record  Raechel Chute, MD [facility]   End of visit medications:  No orders of the defined types were placed in this encounter.    Current Outpatient Medications:    acetaminophen (TYLENOL) 500 MG tablet, Take by mouth., Disp: , Rfl:    adalimumab (HUMIRA, 2 PEN,) 40 MG/0.4ML pen, Inject 0.4 mLs (40 mg total) into the skin every 14 (fourteen) days., Disp: 2 each, Rfl: 2   ADVAIR HFA 115-21 MCG/ACT inhaler, Inhale 2 puffs into the lungs 2 (two) times daily., Disp: , Rfl:    albuterol  (VENTOLIN HFA) 108 (90 Base) MCG/ACT inhaler, Inhale 2 puffs into the lungs every 6 (six) hours as needed., Disp: , Rfl:    Carboxymethylcellulose Sodium (THERATEARS) 0.25 % SOLN, Apply to eye., Disp: , Rfl:    Coenzyme Q10 (CO Q 10 PO), Take by mouth., Disp: , Rfl:    Cyanocobalamin (HM SUPER VITAMIN B12 PO), Take by mouth., Disp: , Rfl:    cyclobenzaprine (FLEXERIL) 5 MG tablet, Take 1 tablet (5 mg total) by mouth at bedtime as needed for muscle spasms., Disp: 90 tablet, Rfl: 0   DULoxetine (CYMBALTA) 30 MG capsule, Take by mouth., Disp: , Rfl:    fluocinolone (VANOS) 0.01 % cream, Apply topically., Disp: , Rfl:    fluticasone (FLONASE) 50 MCG/ACT nasal spray, Place 1 spray into the nose daily., Disp: , Rfl:    folic acid (FOLVITE) 1 MG tablet, Take 1 tablet (1 mg total) by mouth daily., Disp: 90 tablet, Rfl: 3   ibuprofen (ADVIL) 200 MG tablet, Take by mouth., Disp: , Rfl:    ibuprofen (ADVIL,MOTRIN) 600 MG tablet, Take 1 tablet (600 mg total) by mouth every 6 (six) hours as needed for mild pain or moderate pain., Disp: 20 tablet, Rfl: 0   levothyroxine (SYNTHROID) 137 MCG tablet, Take 137 mcg by mouth daily before breakfast., Disp: , Rfl:    MAGNESIUM PO, Take by mouth., Disp: , Rfl:    methotrexate 50 MG/2ML injection, Inject 0.8 mLs (20 mg total) into the skin once a week., Disp: 4 mL, Rfl: 2   montelukast (SINGULAIR) 10 MG tablet, Take 10 mg by mouth at bedtime.,  Disp: , Rfl:    Multiple Vitamin (MULTI-VITAMIN) tablet, Take 1 tablet by mouth daily., Disp: , Rfl:    mupirocin ointment (BACTROBAN) 2 %, Apply once or twice a day during each bandage change, Disp: 22 g, Rfl: 1   naproxen (NAPROSYN) 500 MG tablet, Take 1 tablet by mouth as needed., Disp: , Rfl:    Omega-3 Fatty Acids (FISH OIL PO), Take by mouth., Disp: , Rfl:    omeprazole (PRILOSEC) 40 MG capsule, Take 40 mg by mouth in the morning and at bedtime., Disp: , Rfl:    ondansetron (ZOFRAN ODT) 4 MG disintegrating tablet, Take 1  tablet (4 mg total) by mouth every 8 (eight) hours as needed for nausea or vomiting., Disp: 20 tablet, Rfl: 0   predniSONE (DELTASONE) 5 MG tablet, Take 1 tablet (5 mg total) by mouth daily with breakfast., Disp: 90 tablet, Rfl: 0   pregabalin (LYRICA) 100 MG capsule, Take 1 capsule by mouth 2 (two) times daily., Disp: , Rfl:    terbinafine (LAMISIL) 1 % cream, Apply topically 2 (two) times daily., Disp: , Rfl:    triamcinolone cream (KENALOG) 0.5 %, Apply 1 Application topically as needed., Disp: , Rfl:    TUBERCULIN SYR 1CC/27GX1/2" (B-D TB SYRINGE 1CC/27GX1/2") 27G X 1/2" 1 ML MISC, USE TO INJECT METHOTREXATE UNDER THE SKIN ONCE WEEKLY, Disp: 25 each, Rfl: 0   propranolol (INDERAL) 10 MG tablet, Take 1 tablet by mouth 3 (three) times daily., Disp: , Rfl:    Subjective:   PATIENT ID: Jeanne Bush GENDER: female DOB: 10-Oct-1975, MRN: [id number]  Chief Complaint  Patient presents with   Follow-up    Shortness of breathe on exertion. Occasional cough. No wheezing.     HPI  48 year old female presenting today for follow up.  She feels well overall, with minimal symptom burden. Her breathing is improved, and she denies shortness of breath or cough. She had her PFT's and HRCT last year, both of which returning within normal. PFT's showed mild response to bronchodilator challenge, but other wise spirometry, lung volumes, and DLCO were within normal.  She has a history of rheumatoid arthritis and positive RNP antibody (RA/SLE overlap). She was previously followed by pulmonology at [facility], last seen there in their ILD clinic in April of 2022. She has underwent two high resolution chest CT's that did not show any signs of interstitial lung disease or pulmonary fibrosis. She's had multiple pulmonary function tests during her follow ups. She had reported some exertional dyspnea, and in the setting of some air trapping found on her expiratory films. She was started on advair at the time with improvement in  symptoms. She continues to be compliant with Advair, and reports potential change in her insurance coverage with potential need to change inhaler.   She reports minimal symptoms, mostly shortness of breath with exertion. She has no cough, no chest pain, no chest tightness, and no wheezing. She has no symptoms of systemic inflammation. She does report raynaud's phenomenon in her fingers and toes.   She was followed by rheumatology at [facility] previously and was maintained on Methotrexate. She hasn't been on Methotrexate since losing insurance coverage. PFT's were previously normal at [facility]. She is now established with Rheumatology.   Patient has a history of smoking,10 pack years and quit in 1997. 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  Ancillary information including prior medications, full medical/surgical/family/social histories, and PFTs (when available) are listed below and have been reviewed.  Review of Systems  Constitutional:  Negative for chills, fever, malaise/fatigue and weight loss.  Respiratory:  Positive for shortness of breath. Negative for cough, hemoptysis, sputum production and wheezing.   Cardiovascular:  Negative for chest pain, leg swelling and PND.  Skin:  Negative for rash.  Neurological:  Negative for dizziness.     Objective:   Vitals:   05/29/23 1059  BP: 106/76  Pulse: 81  Temp: 97.6 F (36.4 C)  TempSrc: Temporal  SpO2: 98%  Weight: 209 lb (94.8 kg)  Height: 5\' 1"  (1.549 m)   98% on RA BMI Readings from Last 3 Encounters:  05/29/23 39.49 kg/m  04/09/23 40.81 kg/m  01/31/23 40.82 kg/m   Wt Readings from Last 3 Encounters:  05/29/23 209 lb (94.8 kg)  04/09/23 216 lb (98 kg)  01/31/23 209 lb (94.8 kg)    Physical Exam Constitutional:      General: She is not in acute distress.    Appearance: Normal appearance. She is not ill-appearing.  HENT:     Head: Normocephalic.     Mouth/Throat:     Mouth: Mucous membranes are moist.  Cardiovascular:      Rate and Rhythm: Normal rate and regular rhythm.     Pulses: Normal pulses.     Heart sounds: Normal heart sounds. No murmur heard. Pulmonary:     Effort: Pulmonary effort is normal. No respiratory distress.     Breath sounds: Normal breath sounds. No wheezing or rales.  Abdominal:     General: Abdomen is flat.     Palpations: Abdomen is soft.  Musculoskeletal:     Right lower leg: No edema.     Left lower leg: No edema.  Neurological:     General: No focal deficit present.     Mental Status: She is alert and oriented to person, place, and time. Mental status is at baseline.       Ancillary Information    Past Medical History:  Diagnosis Date   Hyperthyroidism    Mixed connective tissue disease (HCC)    Rheumatoid arthritis (HCC)    Tachycardia      Family History  Problem Relation Age of Onset   Thyroid disease Mother    Arthritis Father    Atrial fibrillation Father    Congestive Heart Failure Father    Breast cancer Maternal Aunt      Past Surgical History:  Procedure Laterality Date   BILATERAL CARPAL TUNNEL RELEASE Bilateral    CESAREAN SECTION     CESAREAN SECTION     TUBAL LIGATION      Social History   Socioeconomic History   Marital status: Married    Spouse name: Not on file   Number of children: Not on file   Years of education: Not on file   Highest education level: Not on file  Occupational History   Not on file  Tobacco Use   Smoking status: Former    Current packs/day: 0.00    Average packs/day: 1 pack/day for 10.0 years (10.0 ttl pk-yrs)    Types: Cigarettes    Start date: 70    Quit date: 56    Years since quitting: 28.1    Passive exposure: Current   Smokeless tobacco: Never  Vaping Use   Vaping status: Never Used  Substance and Sexual Activity   Alcohol use: Yes    Comment: rarely   Drug use: No   Sexual activity: Not on file  Other Topics Concern  Not on file  Social History Narrative   Not on file   Social  Drivers of Health   Financial Resource Strain: Low Risk  (03/08/2022)   Received from [facility], [facility]   Overall Financial Resource Strain (CARDIA)    Difficulty of Paying Living Expenses: Not hard at all  Food Insecurity: No Food Insecurity (03/08/2022)   Received from [facility], [facility]   Hunger Vital Sign    Worried About Running Out of Food in the Last Year: Never true    Ran Out of Food in the Last Year: Never true  Transportation Needs: No Transportation Needs (03/08/2022)   Received from [facility], [facility]   [facility] - Transportation    In the past 12 months, has lack of transportation kept you from medical appointments or from getting medications?: No    Lack of Transportation (Non-Medical): No  Physical Activity: Not on file  Stress: Not on file  Social Connections: Not on file  Intimate Partner Violence: Not on file     Allergies  Allergen Reactions   Hydroxychloroquine Palpitations     CBC    Component Value Date/Time   WBC 4.7 03/12/2023 1400   RBC 3.96 03/12/2023 1400   HGB 12.5 03/12/2023 1400   HGB 13.5 07/02/2013 1634   HCT 36.9 03/12/2023 1400   HCT 40.7 07/02/2013 1634   PLT 310 03/12/2023 1400   PLT 353 07/02/2013 1634   MCV 93.2 03/12/2023 1400   MCV 89 07/02/2013 1634   MCH 31.6 03/12/2023 1400   MCHC 33.9 03/12/2023 1400   RDW 13.7 03/12/2023 1400   RDW 13.1 07/02/2013 1634   LYMPHSABS 1.1 03/12/2023 1400   LYMPHSABS 2.8 07/02/2013 1634   MONOABS 0.4 03/12/2023 1400   MONOABS 0.8 07/02/2013 1634   EOSABS 0.0 03/12/2023 1400   EOSABS 0.2 07/02/2013 1634   BASOSABS 0.0 03/12/2023 1400   BASOSABS 0.1 07/02/2013 1634    Pulmonary Functions Testing Results:    Latest Ref Rng & Units 11/22/2022   10:42 AM  PFT Results  FVC-Pre L 2.90   FVC-Predicted Pre % 88   FVC-Post L 2.96   FVC-Predicted Post % 90   Pre FEV1/FVC %  % 79   Post FEV1/FCV % % 87   FEV1-Pre L 2.30   FEV1-Predicted Pre % 87   FEV1-Post L 2.56   DLCO uncorrected ml/min/mmHg 19.43   DLCO UNC% % 100   DLVA Predicted % 100   TLC L 4.64   TLC % Predicted % 100   RV % Predicted % 102     Outpatient Medications Prior to Visit  Medication Sig Dispense Refill   acetaminophen (TYLENOL) 500 MG tablet Take by mouth.     adalimumab (HUMIRA, 2 PEN,) 40 MG/0.4ML pen Inject 0.4 mLs (40 mg total) into the skin every 14 (fourteen) days. 2 each 2   ADVAIR HFA 115-21 MCG/ACT inhaler Inhale 2 puffs into the lungs 2 (two) times daily.     albuterol (VENTOLIN HFA) 108 (90 Base) MCG/ACT inhaler Inhale 2 puffs into the lungs every 6 (six) hours as needed.     Carboxymethylcellulose Sodium (THERATEARS) 0.25 % SOLN Apply to eye.     Coenzyme Q10 (CO Q 10 PO) Take by mouth.     Cyanocobalamin (HM SUPER VITAMIN B12 PO) Take by mouth.     cyclobenzaprine (FLEXERIL) 5 MG tablet Take 1 tablet (5  mg total) by mouth at bedtime as needed for muscle spasms. 90 tablet 0   DULoxetine (CYMBALTA) 30 MG capsule Take by mouth.     fluocinolone (VANOS) 0.01 % cream Apply topically.     fluticasone (FLONASE) 50 MCG/ACT nasal spray Place 1 spray into the nose daily.     folic acid (FOLVITE) 1 MG tablet Take 1 tablet (1 mg total) by mouth daily. 90 tablet 3   ibuprofen (ADVIL) 200 MG tablet Take by mouth.     ibuprofen (ADVIL,MOTRIN) 600 MG tablet Take 1 tablet (600 mg total) by mouth every 6 (six) hours as needed for mild pain or moderate pain. 20 tablet 0   levothyroxine (SYNTHROID) 137 MCG tablet Take 137 mcg by mouth daily before breakfast.     MAGNESIUM PO Take by mouth.     methotrexate 50 MG/2ML injection Inject 0.8 mLs (20 mg total) into the skin once a week. 4 mL 2   montelukast (SINGULAIR) 10 MG tablet Take 10 mg by mouth at bedtime.     Multiple Vitamin (MULTI-VITAMIN) tablet Take 1 tablet by mouth daily.     mupirocin ointment (BACTROBAN) 2 % Apply once or twice a  day during each bandage change 22 g 1   naproxen (NAPROSYN) 500 MG tablet Take 1 tablet by mouth as needed.     Omega-3 Fatty Acids (FISH OIL PO) Take by mouth.     omeprazole (PRILOSEC) 40 MG capsule Take 40 mg by mouth in the morning and at bedtime.     ondansetron (ZOFRAN ODT) 4 MG disintegrating tablet Take 1 tablet (4 mg total) by mouth every 8 (eight) hours as needed for nausea or vomiting. 20 tablet 0   predniSONE (DELTASONE) 5 MG tablet Take 1 tablet (5 mg total) by mouth daily with breakfast. 90 tablet 0   pregabalin (LYRICA) 100 MG capsule Take 1 capsule by mouth 2 (two) times daily.     terbinafine (LAMISIL) 1 % cream Apply topically 2 (two) times daily.     triamcinolone cream (KENALOG) 0.5 % Apply 1 Application topically as needed.     TUBERCULIN SYR 1CC/27GX1/2" (B-D TB SYRINGE 1CC/27GX1/2") 27G X 1/2" 1 ML MISC USE TO INJECT METHOTREXATE UNDER THE SKIN ONCE WEEKLY 25 each 0   propranolol (INDERAL) 10 MG tablet Take 1 tablet by mouth 3 (three) times daily.     omeprazole (PRILOSEC) 20 MG capsule Take 20 mg by mouth daily. (Patient not taking: Reported on 04/09/2023)     No facility-administered medications prior to visit.

## 2023-05-30 ENCOUNTER — Other Ambulatory Visit: Payer: Self-pay

## 2023-06-03 ENCOUNTER — Other Ambulatory Visit: Payer: Self-pay | Admitting: Internal Medicine

## 2023-06-03 DIAGNOSIS — M05741 Rheumatoid arthritis with rheumatoid factor of right hand without organ or systems involvement: Secondary | ICD-10-CM

## 2023-06-03 DIAGNOSIS — M351 Other overlap syndromes: Secondary | ICD-10-CM

## 2023-06-03 NOTE — Telephone Encounter (Signed)
 Last Fill: 11/30/2022  Next Visit: 07/08/2023  Last Visit: 04/09/2023  Dx: Rheumatoid arthritis involving both hands with positive rheumatoid factor (HCC)   Current Dose per office note on 04/09/2023: Prednisone 5mg  daily   Okay to refill Prednisone?

## 2023-06-24 NOTE — Progress Notes (Signed)
 Office Visit Note  Patient: Jeanne Bush             Date of Birth: 01-25-1976           MRN: [id number]             PCP: Martha Slack, DO Referring: Martha Slack, DO Visit Date: 07/08/2023   Subjective:  Follow-up (Patient states she wants to talk about the humira, she did not take her last dose. Patient states she is going to see a Insurance account manager. )   Discussed the use of AI scribe software for clinical note transcription with the patient, who gave verbal consent to proceed.  History of Present Illness   Jeanne Bush is a 48 y.o. female here for follow up with mixed connective tissue disease/RA overlap on methotrexate 20 mg subcu weekly, folic acid 1 mg daily, and adalimumab 40 mg subcu q. 14 days.    She has been experiencing worsening visual symptoms since starting Humira, describing a sensation of pressure in her head, particularly around the optic nerves. She has not yet seen a neurologist for these symptoms but has an appointment scheduled soon. A lumbar puncture performed a few years ago showed elevated intracranial pressure, though not significantly high.  Persistent skin rashes have not improved with Humira. The rashes are described as scabbed over and draining. Additionally, there has been no significant improvement in joint pain or stiffness since starting the medication.  Her past medical history includes a previous episode of shingles following chickenpox in childhood and eczema, particularly on her hands and legs. Her husband has a history of severe diverticulitis, but she does not.  No improvement in skin rashes, joint pain, or stiffness with Humira. No history of severe diverticulitis. No cardiovascular disease or history of blood clots. No history of strokes or heart attacks.       Has lipid panel 05/15/23  Previous HPI 04/09/2023 Jeanne Bush is a 48 y.o. female here for follow up with mixed connective tissue disease/RA overlap on methotrexate 20 mg  subcu weekly, folic acid 1 mg daily, and adalimumab 40 mg subcu q. 14 days. She has been on Humira for approximately two months now. They report no significant improvement in joint pain or skin rashes, but note that the lesion on the buttocks appears to be scabbing over.   The patient also reports a persistent cluster headache, which began shortly after the initiation of Humira. The headache is characterized by a sensation of pressure at the back of the eyes and a kaleidoscope effect on the periphery of their vision. This has been ongoing for about three weeks, which is significantly longer than their usual headache duration of one to two days. The patient denies any associated pain, palpitations, or sinus symptoms.   The patient is also on prednisone, methotrexate, and a muscle relaxer. They have noticed some weight gain since their last visit, which they attribute to the prednisone. They are also in the process of switching insurance due to approval of disability, and plan to see an optometrist or ophthalmologist once this is finalized.   The patient denies any major flare-ups or swollen areas since the last visit. They report that joint pain has either been normal or slightly improved, but muscle pain persists. They also mention a persistent rash in the groin area.      Previous HPI 01/31/2023 Jeanne Bush is a 48 y.o. female here for follow up with mixed connective tissue disease/RA overlap on methotrexate 20  mg subcu weekly and folic acid 1 mg daily.  Since our last visit joint pain and stiffness is doing pretty well but remains on 5 mg prednisone daily.  She was able to see dermatology with [facility] for skin biopsy that confirmed hidradenitis.  Stillness we discussed previous recommendation for biologic DMARD treatment here to discuss medication and needs baseline laboratory testing.   Previous HPI 11/30/2022 Jeanne Bush is a 48 y.o. female here for follow up with mixed connective tissue disease/RA  overlap on methotrexate 20 mg subcu weekly and folic acid 1 mg daily.  After finishing the previous prednisone course developed a significant increase in joint pain and stiffness and muscular pain throughout upper and lower extremities.  Especially sore in her fingers and wrists.  Resuming prednisone at 5 mg daily helped a lot with these musculoskeletal symptoms.  Still having issues with chronic fatigue and brain fog type concentration difficulty.  She is having some more indigestion due to stopping her omeprazole at recommendations of the pharmacist with starting methotrexate.  Also found to have a rash on her buttocks and gluteal region is recommended to see dermatology for suspicion of possible hidradenitis suppurativa.  No previous history of similar chronic skin lesions no involvement in the axilla.   Previous HPI 08/29/2022 Jeanne Bush is a 48 y.o. female here for follow up with mixed connective tissue disease/RA overlap after resuming methotrexate 20 mg subcu weekly and folic acid 1 mg daily since our visit in March.  She did not notice any appreciable side effect while resuming methotrexate but is also still having a good amount of symptoms with pain and stiffness and fatigue.  For about the past 1 week he has significant increase in pain especially throughout her chest and back.  This is most noticeable when trying to roll over in bed or with any direct pressure on these areas.  Has not experienced any new peripheral joint swelling.  She is also having more trouble with acid reflux after discontinuing omeprazole at her pharmacist recommendation since starting methotrexate.  This drug was originally recommended based on her ENT with what sounds like laryngal pharyngeal reflux symptoms. Does not also complaining of feeling excessively dry all the time despite drinking a lot of water daily and frequent urination.  Not specifically dryness of the eyes or mouth although she does get benefit using the  eyedrops.   Previous HPI 06/25/22 Jeanne Bush is a 48 y.o. 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 Gillermo Lack at [facility]. She was on subcutaneous methotrexate treatment and cymbalta, lyrica, and voltaren for symptoms due to arthritis and the fibromyalgia. Labs positive for ANA, RNP, and RF. She has had some shortness of breath and pulmonary studies indicative for small airways disease with no pulmonary fibrosis on previous CT chest exam. History of eczema treated with cellcept and methotrexate, had previous biopsy for this in 2014. Hydroxychloroquine was stopped due to increasing dizziness symptoms.  She has now been off methotrexate due to lack of clinical follow-up after she lost the insurance.  Off the medication has experienced some increase in joint pain and stiffness as well as eczematous rashes. She experiences some swelling as well as pain and stiffness in both hands.  Had carpal tunnel release surgery last year without any great relief of symptoms and is still getting problems with the hand and wrist pains and sometimes numbness.  Does not  see much swelling in her other joints.  She pretty often experiences pain across the middle and upper chest region this had been suspected as pleurisy by some previous providers no particular diagnostic testing or findings associated with this. She has longstanding history of Raynaud's symptoms not associated with any ulcers or digital pitting. Eczema skin rashes on the chest are new since.  Previously had axillary skin lesions questionable for hidradenitis suppurativa but these pretty much resolved with stopping shaving. She originally had onset of chronic joint pain and sensitivity since she was a teenager.  The symptoms were attributed as growing pains but the generalized pain and sensitivity she has now remained similar  to what she experienced at that time.  She gets sensitivity to touch pretty much all over on a daily basis.  Also with persistent brain fog and fatigue.  She has chronic mild constipation symptoms but without any serious GI complications.       Labs reviewed 2019 SSA pos U1RNP >150   07/2016 ANA pos RNP pos RF 36 HCV neg HBV neg   02/25/19 HRCT Chest w/o Contrast No pulmonary fibrosis   Review of Systems  Constitutional:  Positive for fatigue.  HENT:  Positive for mouth sores and mouth dryness.   Eyes:  Positive for dryness.  Respiratory:  Positive for shortness of breath.   Cardiovascular:  Positive for chest pain. Negative for palpitations.  Gastrointestinal:  Negative for blood in stool, constipation and diarrhea.  Endocrine: Negative for increased urination.  Genitourinary:  Negative for involuntary urination.  Musculoskeletal:  Positive for joint pain, joint pain, joint swelling, myalgias, muscle weakness, morning stiffness, muscle tenderness and myalgias. Negative for gait problem.  Skin:  Positive for sensitivity to sunlight. Negative for color change, rash and hair loss.  Allergic/Immunologic: Negative for susceptible to infections.  Neurological:  Positive for dizziness and headaches.  Hematological:  Negative for swollen glands.  Psychiatric/Behavioral:  Positive for sleep disturbance. Negative for depressed mood. The patient is not nervous/anxious.     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   Mixed connective tissue disease (HCC) 03/08/2022   Raynaud's disease without gangrene 03/08/2022   Myopia of both eyes with astigmatism 01/15/2017   Plaquenil causing adverse effect in  therapeutic use 01/15/2017   Rheumatoid arthritis involving both hands with positive rheumatoid factor (HCC) 10/12/2016   Allergic rhinitis 08/10/2013   Gastro-esophageal reflux disease without esophagitis 06/15/2013   Hypothyroidism 06/15/2013   Atopic dermatitis 06/15/2013   Obesity 06/15/2013   Plantar fascial fibromatosis 06/15/2013   Pure hyperglyceridemia 06/15/2013   Palpitations 01/07/2013   Sinus tachycardia 01/06/2013   Low back pain 11/18/2012    Past Medical History:  Diagnosis Date   Hyperthyroidism    Mixed connective tissue disease (HCC)    Rheumatoid arthritis (HCC)    Tachycardia     Family History  Problem Relation Age of Onset   Thyroid disease Mother    Arthritis Father    Atrial fibrillation Father    Congestive Heart Failure Father    Breast cancer Maternal Aunt    Past Surgical History:  Procedure Laterality Date   BILATERAL CARPAL TUNNEL RELEASE Bilateral    CESAREAN SECTION     CESAREAN  SECTION     TUBAL LIGATION     Social History   Social History Narrative   Not on file   Immunization History  Administered Date(s) Administered   Influenza,inj,Quad PF,6+ Mos 01/07/2013, 01/31/2017, 01/15/2019, 04/07/2020, 03/08/2022   Influenza-Unspecified 02/13/2017   Pneumococcal Conjugate-13 06/11/2017   Tdap 01/18/2005, 08/10/2015     Objective: Vital Signs: BP 109/77 (BP Location: Left Arm, Patient Position: Sitting, Cuff Size: Large)   Pulse 87   Resp 14   Ht 5\' 1"  (1.549 m)   Wt 208 lb (94.3 kg)   LMP 06/30/2023   BMI 39.30 kg/m    Physical Exam Cardiovascular:     Rate and Rhythm: Normal rate and regular rhythm.  Pulmonary:     Effort: Pulmonary effort is normal.     Breath sounds: Normal breath sounds.  Skin:    General: Skin is warm and dry.  Neurological:     Mental Status: She is alert.  Psychiatric:        Mood and Affect: Mood normal.      Musculoskeletal Exam:  Neck tightness, including anterior tightness and tightness  on rotation and movement.  Shoulders full ROM no tenderness or swelling Elbows full ROM no tenderness or swelling Wrists full ROM no tenderness or swelling Fingers full ROM no tenderness or swelling Knees full ROM no tenderness or swelling    Investigation: No additional findings.  Imaging: No results found.  Recent Labs: Lab Results  Component Value Date   WBC 4.7 03/12/2023   HGB 12.5 03/12/2023   PLT 310 03/12/2023   NA 133 (L) 03/12/2023   K 3.5 03/12/2023   CL 102 03/12/2023   CO2 22 03/12/2023   GLUCOSE 105 (H) 03/12/2023   BUN 10 03/12/2023   CREATININE 0.72 03/12/2023   BILITOT 0.7 03/12/2023   ALKPHOS 25 (L) 03/12/2023   AST 34 03/12/2023   ALT 36 03/12/2023   PROT 7.6 03/12/2023   ALBUMIN 3.5 03/12/2023   CALCIUM 8.7 (L) 03/12/2023   GFRAA >60 08/18/2016   QFTBGOLDPLUS NEGATIVE 01/31/2023    Speciality Comments: Humira started 02/25/23  Procedures:  No procedures performed Allergies: Hydroxychloroquine   Assessment / Plan:     Visit Diagnoses: Rheumatoid arthritis involving both hands with positive rheumatoid factor (HCC) Inadequate response to Humira with persistent joint pain and stiffness symptoms bad in proportion to degree of synovitis. Also concern for possible side effects. Considering Rinvoq due to its mechanism of action and potential to replace methotrexate. - Plan switch to rinvoq 15 mg daily - Okay to d/c methotrexate with change to JAK inhibitor, less synergitic benefit than adalimumab - Monitor cholesterol levels before and after starting Rinvoq. - Recommend Shingrix vaccine if starting Rinvoq.  High risk medication use - methotrexate 20 mg subcu weekly, adalimumab 40 mg subcu q. 14 days. Discussed risks of Rinvoq including infection, cytopenias, malignancy, blood clots, bowel inflammation, or cardiovascular events. No personal CVD history, cancer Hx, or IBD. Reviewed recent lipid panel from February okay -Checking CBC and CMP for  medication monitoring  Myofascial pain - On Lyrica and Cymbalta for this already Muscle Tightness and Spasms Likely due to poor posture and muscle imbalance. Discussed muscle guarding and nerve retraining. - Provide handout with stretching exercises. - Encourage regular stretching exercises.   Headaches Suspected intracranial hypertension with normal eye exam and imaging showing empty sella. Symptoms include visual disturbances. Neurologist consultation pending. - Discontinue Humira. - Consult with neurologist for further evaluation.  Hidradenitis Suppurativa Persistent  skin rashes not improved with Humira. Considering Rinvoq as it is indicated for this condition.  Eczema Occasional flare-ups. Considering Rinvoq for its indication in severe eczema.    Orders: No orders of the defined types were placed in this encounter.  No orders of the defined types were placed in this encounter.    Follow-Up Instructions: Return in about 3 months (around 10/07/2023) for RA/HS UPA start/MTX f/u 3mos.   Matt Song, MD  Note - This record has been created using [employer].  Chart creation errors have been sought, but may not always  have been located. Such creation errors do not reflect on  the standard of medical care.

## 2023-06-26 ENCOUNTER — Other Ambulatory Visit (HOSPITAL_COMMUNITY): Payer: Self-pay

## 2023-06-26 ENCOUNTER — Encounter: Payer: Self-pay | Admitting: Internal Medicine

## 2023-06-27 ENCOUNTER — Other Ambulatory Visit: Payer: Self-pay | Admitting: Pharmacy Technician

## 2023-06-27 ENCOUNTER — Other Ambulatory Visit: Payer: Self-pay

## 2023-06-27 NOTE — Progress Notes (Signed)
 Specialty Pharmacy Refill Coordination Note  Jeanne Bush is a 48 y.o. female contacted today regarding refills of specialty medication(s) Adalimumab (Humira (2 Pen))   Patient requested (Patient-Rptd) Delivery   Delivery date: (Patient-Rptd) 07/04/23   Verified address: (Patient-Rptd) [address] [name], [place] [address]   Medication will be filled on 07/03/2023.

## 2023-07-01 NOTE — Progress Notes (Signed)
 Initial neurology clinic note  Charron Coultas MRN: [id number] DOB: [birth year]  Referring provider: Araceli Bouche, DO  Primary care provider: Araceli Bouche, DO  Reason for consult:  headaches  Subjective:  This is Ms. Jeanne Bush, a 48 y.o. 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 The patient is alone today.  Patient's symptoms started at least couple of years ago. She feels a constant pressure on the top of her head. It gets worse with increased pressure due to weather (storming weather is worse). She has ringing in her ear that is worse with stormy weather as well. She has poor smell. She does not have significant pain in her head, like pounding etc. She has some associated vision changes as well. She has kaleidoscope effect in her eyes and darkening of vision. She sometimes has a strobe light sensation in her eyes, but this seemed to be associated with Humira. She gets the kaleidoscope effect a couple of times a month. She also feels there is a shadow in her lower vision. She also feels like she can see a beating of a pulse in her eyes and feel it in her face. Changing head positions or walking a lot will increase this.   She thinks the pressure sensation gets worse when standing and is not worse with laying down. She has pain in the neck and back of neck. She denies photophobia, phonophobia, or nausea.  Patient saw neurology at [facility] in 02/2020 for symptoms and migraine vs IIH was suspected. LP on 05/06/20 showed an opening pressure of 23 (only 9 cc removed due to poor flow). She had an MRI in 05/2023 that showed a partially empty sella and sinus thickening.  She has never been on medication for her symptoms. Patient recently stopped Humira. She is on methotrexate and  prednisone 10 mg daily for RA and Sjogren's currently. She takes Lyrica and flexeril for fibromyalgia. She takes propranolol 10 mg TID for tachycardia.  She takes vit B complex, vit D, magnesium, and CoQ10, and multivitamin.  Weight changes: No significant changes Caffeine use: 1 cup of coffee per day, no soda, no tea, no energy drinks EtOH use: Very rare Smoker: former, but remote OCP use: No, "tubes tied" Family history of neurologic disease, including headaches: Son with Becker's MD, autism, and headaches  MEDICATIONS:  Outpatient Encounter Medications as of 07/10/2023  Medication Sig   acetaminophen (TYLENOL) 500 MG tablet Take by mouth.   ADVAIR HFA 115-21 MCG/ACT inhaler Inhale 2 puffs into the lungs 2 (two) times daily.   albuterol (VENTOLIN HFA) 108 (90 Base) MCG/ACT inhaler Inhale 2 puffs into the lungs every 6 (six) hours as needed.   b complex vitamins capsule Take 1 capsule by mouth daily.   Carboxymethylcellulose Sodium (THERATEARS) 0.25 % SOLN Apply to eye.   Coenzyme Q10 (CO Q 10 PO) Take by mouth.   cyclobenzaprine (FLEXERIL) 5 MG tablet Take 1 tablet (5 mg total) by mouth at bedtime as needed for muscle spasms.   DULoxetine (CYMBALTA) 30 MG capsule Take by mouth.   fluocinolone (VANOS) 0.01 % cream Apply topically.   fluticasone (FLONASE) 50 MCG/ACT nasal spray Place 1 spray into the nose daily.   folic acid (FOLVITE) 1 MG tablet Take 1 tablet (1 mg total) by mouth daily.   ibuprofen (ADVIL) 200 MG tablet  Take by mouth.   ibuprofen (ADVIL,MOTRIN) 600 MG tablet Take 1 tablet (600 mg total) by mouth every 6 (six) hours as needed for mild pain or moderate pain.   levothyroxine (SYNTHROID) 137 MCG tablet Take 137 mcg by mouth daily before breakfast.   MAGNESIUM PO Take by mouth.   methotrexate 50 MG/2ML injection Inject 0.8 mLs (20 mg total) into the skin once a week.   montelukast (SINGULAIR) 10 MG tablet Take 10 mg by mouth at bedtime.   Multiple Vitamin (MULTI-VITAMIN)  tablet Take 1 tablet by mouth daily.   naproxen (NAPROSYN) 500 MG tablet Take 1 tablet by mouth as needed.   Omega-3 Fatty Acids (FISH OIL PO) Take by mouth.   omeprazole (PRILOSEC) 40 MG capsule Take 40 mg by mouth in the morning and at bedtime.   predniSONE (DELTASONE) 5 MG tablet TAKE 1 TABLET(5 MG) BY MOUTH DAILY WITH BREAKFAST   pregabalin (LYRICA) 100 MG capsule Take 1 capsule by mouth 2 (two) times daily.   propranolol (INDERAL) 10 MG tablet Take 1 tablet by mouth 3 (three) times daily.   terbinafine (LAMISIL) 1 % cream Apply topically 2 (two) times daily.   triamcinolone cream (KENALOG) 0.5 % Apply 1 Application topically as needed.   TUBERCULIN SYR 1CC/27GX1/2" (B-D TB SYRINGE 1CC/27GX1/2") 27G X 1/2" 1 ML MISC USE TO INJECT METHOTREXATE UNDER THE SKIN ONCE WEEKLY   adalimumab (HUMIRA, 2 PEN,) 40 MG/0.4ML pen Inject 0.4 mLs (40 mg total) into the skin every 14 (fourteen) days. (Patient not taking: Reported on 07/10/2023)   Cyanocobalamin (HM SUPER VITAMIN B12 PO) Take by mouth. (Patient not taking: Reported on 07/10/2023)   mupirocin ointment (BACTROBAN) 2 % Apply once or twice a day during each bandage change (Patient not taking: Reported on 07/10/2023)   ondansetron (ZOFRAN ODT) 4 MG disintegrating tablet Take 1 tablet (4 mg total) by mouth every 8 (eight) hours as needed for nausea or vomiting. (Patient not taking: Reported on 07/10/2023)   No facility-administered encounter medications on file as of 07/10/2023.    PAST MEDICAL HISTORY: Past Medical History:  Diagnosis Date   Hyperthyroidism    Mixed connective tissue disease (HCC)    Rheumatoid arthritis (HCC)    Tachycardia     PAST SURGICAL HISTORY: Past Surgical History:  Procedure Laterality Date   BILATERAL CARPAL TUNNEL RELEASE Bilateral    CESAREAN SECTION     CESAREAN SECTION     TUBAL LIGATION      ALLERGIES: Allergies  Allergen Reactions   Hydroxychloroquine Palpitations    FAMILY HISTORY: Family History   Problem Relation Age of Onset   Thyroid disease Mother    Arthritis Father    Atrial fibrillation Father    Congestive Heart Failure Father    Breast cancer Maternal Aunt     SOCIAL HISTORY: Social History   Tobacco Use   Smoking status: Former    Current packs/day: 0.00    Average packs/day: 1 pack/day for 10.0 years (10.0 ttl pk-yrs)    Types: Cigarettes    Start date: 89    Quit date: 7    Years since quitting: 28.2    Passive exposure: Current   Smokeless tobacco: Never  Vaping Use   Vaping status: Never Used  Substance Use Topics   Alcohol use: Yes    Comment: rarely   Drug use: No   Social History   Social History Narrative   Are you right handed or left handed? Right   Are  you currently employed ? 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 no   Who lives with you? family   What type of home do you live in: 1 story or 2 story? three   Caffiene one cup in AM     Objective:  Vital Signs:  BP 124/76   Pulse 86   Ht 5\' 1"  (1.549 m)   Wt 207 lb (93.9 kg)   LMP 06/30/2023   SpO2 100%   BMI 39.11 kg/m   General: No acute distress.  Patient appears well-groomed.   Head:  Normocephalic/atraumatic Eyes:  fundi examined, disc margins clear, no obvious papilledema Neck: supple, positive for paraspinal tenderness, reduced range of motion Heart: regular rate and rhythm Lungs: Clear to auscultation bilaterally. Vascular: No carotid bruits.  Neurological Exam: Mental status: alert and oriented, speech fluent and not dysarthric, language intact.  Cranial nerves: CN I: not tested CN II: pupils equal, round and reactive to light, visual fields intact CN III, IV, VI:  full range of motion, no nystagmus, no ptosis CN V: facial sensation intact. CN VII: upper and lower face symmetric CN VIII: hearing intact CN IX, X: uvula midline CN XI: sternocleidomastoid and trapezius muscles intact CN XII: tongue midline  Bulk &  Tone: normal, no fasciculations. Motor:  muscle strength 5/5 throughout Deep Tendon Reflexes:  2+ throughout.   Sensation: Light touch sensation intact. Finger to nose testing:  Without dysmetria.    Gait:  Normal station and stride.  Romberg negative.   Labs and Imaging review: Internal labs: ESR (04/11/23): 33  03/12/23: CBC unremarkable CMP significant for Na 133, glucose 105  Vit D (01/31/23): 24  External labs: 05/15/23: B12 237 TSH wnl Lipid panel: tChol 175, LDL 113, TG 68  Imaging/Procedures: MRI brain w/wo contrast (05/21/23): FINDINGS: Brain:   No age-advanced or lobar predominant cerebral atrophy.   Partially empty sella turcica.   No cortical encephalomalacia is identified. No significant cerebral white matter disease.   There is no acute infarct.   No evidence of an intracranial mass.   No chronic intracranial blood products.   No extra-axial fluid collection.   No midline shift.   No pathologic intracranial enhancement identified.   Vascular: Maintained flow voids within the proximal large arterial vessels. Small developmental venous anomaly within the posterior right frontal lobe (anatomic variant).   Skull and upper cervical spine: No focal worrisome marrow lesion.   Sinuses/Orbits: No mass or acute finding within the imaged orbits. Mild mucosal thickening within the left maxillary sinus.   Other: Trace fluid within right mastoid air cells.   IMPRESSION: 1. No evidence of an acute intracranial abnormality. 2. Partially empty sella turcica. This finding can reflect incidental anatomic variation, or alternatively, it can be associated with chronic idiopathic intracranial hypertension (pseudotumor cerebri). 3. Mild left maxillary sinus mucosal thickening. 4. Trace fluid within right mastoid air cells.  External EMG of [facility] - 03/10/2019): Conclusions:  This is an abnormal study.   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   There is no electrophysiologic evidence of a right cubital tunnel  syndrome or ulnar mononeuropathy.  There is also no sonographic  evidence of cubital tunnel syndrome, however there is ulnar nerve  partial subluxation over the medial epicondyle with elbow  flexion.   MRI Cervical  spine wo contrast (external - 12/12/2018): Progression of multilevel degenerative changes of the cervical spine with varying degrees of canal stenosis (moderate C6-C7) and foraminal narrowing (moderate right C6-C7), as detailed above.    Assessment/Plan:  Afton Lavalle is a 48 y.o. female who presents for evaluation of head pressure and vision changes. 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 Her neurological examination is essentially normal. Available diagnostic data is significant for LP in 2021 showing an opening pressure of 23 and MRI brain in 05/2023 showing partially empty sella. The opening pressure is borderline and partially empty sella is often a normal MRI finding, however, given her symptoms, it is safer to treat this as idiopathic intracranial hypertension (IIH). She is also on methotrexate which has some case reports suggesting an association with IIH. Migraine with aura is also possible, though patient does not have significant head pain or migraine like features. Cervicalgia could also be contributing to head pain.  PLAN: -B12 1000 mcg daily supplementation -Continue Vit D supplementation -Continue propranolol 10 mg TID as this could help with headaches -Continue magnesium and CoQ10 as these can help with headaches -Will start Topamax 25 mg daily as this can treat IIH or migraine -Discussed PT for cervicalgia, but patient would like to  defer for now  -Return to clinic 3-4 months  The impression above as well as the plan as outlined below were extensively discussed with the patient who voiced understanding. All questions were answered to their satisfaction.  When available, results of the above investigations and possible further recommendations will be communicated to the patient via telephone/MyChart. Patient to call office if not contacted after expected testing turnaround time.   Total time spent reviewing records, interview, history/exam, documentation, and coordination of care on day of encounter:  55 min   Thank you for allowing me to participate in patient's care.  If I can answer any additional questions, I would be pleased to do so.  Jacquelyne Balint, MD   CC: [place], [place], [place] [address] [place] [address]  CC: Referring provider: Araceli Bouche, [place] [address] [place],  [place] [address]

## 2023-07-08 ENCOUNTER — Encounter: Payer: Self-pay | Admitting: Internal Medicine

## 2023-07-08 ENCOUNTER — Ambulatory Visit: Payer: Self-pay | Attending: Internal Medicine | Admitting: Internal Medicine

## 2023-07-08 VITALS — BP 109/77 | HR 87 | Resp 14 | Ht 61.0 in | Wt 208.0 lb

## 2023-07-08 DIAGNOSIS — M05741 Rheumatoid arthritis with rheumatoid factor of right hand without organ or systems involvement: Secondary | ICD-10-CM

## 2023-07-08 DIAGNOSIS — L732 Hidradenitis suppurativa: Secondary | ICD-10-CM

## 2023-07-08 DIAGNOSIS — Z79899 Other long term (current) drug therapy: Secondary | ICD-10-CM

## 2023-07-08 DIAGNOSIS — M351 Other overlap syndromes: Secondary | ICD-10-CM

## 2023-07-08 DIAGNOSIS — Z7952 Long term (current) use of systemic steroids: Secondary | ICD-10-CM

## 2023-07-08 DIAGNOSIS — I7300 Raynaud's syndrome without gangrene: Secondary | ICD-10-CM | POA: Diagnosis not present

## 2023-07-08 DIAGNOSIS — M05742 Rheumatoid arthritis with rheumatoid factor of left hand without organ or systems involvement: Secondary | ICD-10-CM | POA: Diagnosis not present

## 2023-07-08 DIAGNOSIS — L209 Atopic dermatitis, unspecified: Secondary | ICD-10-CM

## 2023-07-08 DIAGNOSIS — M7918 Myalgia, other site: Secondary | ICD-10-CM

## 2023-07-08 NOTE — Progress Notes (Signed)
 Pharmacy Note  Subjective: Patient presents today to [facility] for follow up office visit. Patient seen by the pharmacist for counseling on Rinvoq for rheumatoid arthritis and atopic dermatitis and HS .Marland Kitchen  Previous therapy include: Humira (caused migraines). Hydroxychloroquine caused palpitations  Currently on methotrexate  History of diverticulitis:  No  History of MI, stroke, or CV events:  No  Objective:  CMP     Component Value Date/Time   NA 133 (L) 03/12/2023 1449   NA 138 07/02/2013 1634   K 3.5 03/12/2023 1449   K 4.0 07/02/2013 1634   CL 102 03/12/2023 1449   CL 103 07/02/2013 1634   CO2 22 03/12/2023 1449   CO2 28 07/02/2013 1634   GLUCOSE 105 (H) 03/12/2023 1449   GLUCOSE 85 07/02/2013 1634   BUN 10 03/12/2023 1449   BUN 12 07/02/2013 1634   CREATININE 0.72 03/12/2023 1449   CREATININE 0.74 01/31/2023 0833   CALCIUM 8.7 (L) 03/12/2023 1449   CALCIUM 9.1 07/02/2013 1634   PROT 7.6 03/12/2023 1449   PROT 8.0 07/02/2013 1634   ALBUMIN 3.5 03/12/2023 1449   ALBUMIN 3.6 07/02/2013 1634   AST 34 03/12/2023 1449   AST 20 07/02/2013 1634   ALT 36 03/12/2023 1449   ALT 27 07/02/2013 1634   ALKPHOS 25 (L) 03/12/2023 1449   ALKPHOS 41 (L) 07/02/2013 1634    CBC    Component Value Date/Time   WBC 4.7 03/12/2023 1400   RBC 3.96 03/12/2023 1400   HGB 12.5 03/12/2023 1400   HGB 13.5 07/02/2013 1634   HCT 36.9 03/12/2023 1400   HCT 40.7 07/02/2013 1634   PLT 310 03/12/2023 1400   PLT 353 07/02/2013 1634   MCV 93.2 03/12/2023 1400   MCV 89 07/02/2013 1634   MCH 31.6 03/12/2023 1400   MCHC 33.9 03/12/2023 1400   RDW 13.7 03/12/2023 1400   RDW 13.1 07/02/2013 1634    Baseline Immunosuppressant Therapy Labs TB GOLD    Latest Ref Rng & Units 01/31/2023    8:33 AM  Quantiferon TB Gold  Quantiferon TB Gold Plus NEGATIVE NEGATIVE    Hepatitis Panel   HIV No results found for: "HIV" Immunoglobulins   SPEP    Latest Ref Rng & Units  03/12/2023    2:49 PM  Serum Protein Electrophoresis  Total Protein 6.5 - 8.1 g/dL 7.6    U0AV No results found for: "G6PDH" TPMT No results found for: "TPMT"   Lipid Panel No results found for: "CHOL", "HDL", "LDLCALC", "LDLDIRECT", "TRIG", "CHOLHDL"   Assessment/Plan:  Counseled patient that Rinvoq is a JAK inhibitor indicated for Rheumatoid Arthritis.  Counseled patient on purpose, proper use, and adverse effects of Rinvoq.    Reviewed the most common adverse effects including infection, diarrhea, headaches.  Also reviewed rare adverse effects such as bowel injury and the need to contact us if they develop stomach pain during treatment. Counseled on the increase risk of venous thrombosis. Counseled about FDA black box warning of MACE (major adverse CV events including cardiovascular death, myocardial infarction, and stroke).  Reviewed with patient that there is the possibility of an increased risk of malignancy specifically lung cancer and lymphomas but it is not well understood if this increased risk is due to the medication or the disease state. Instructed patient that medication should be held for infection and prior to surgery.  Advised patient to avoid live vaccines. Recommend annual influenza, PCV 15 or PCV20 or Pneumovax 23, and Shingrix as  indicated.    Reviewed importance of routine lab monitoring including lipid panel.  Will recheck lipid panel 3 months after starting and annually thereafter. CBC and CMP will be monitored routinely every 3 months. Standing orders placed. Provided patient with medication education material and answered all questions.  Patient consented to Rinvoq.  Will upload into patient's chart.  Will apply through patient's insurance and update when we receive a response.    Patient dose will be 15 mg daily.  Prescription will be sent to pharmacy pending lab results and insurance approval. Also pending appt with neuro-ophthalmologist which is tomorrow  Chesley Mires, PharmD, MPH, BCPS, CPP Clinical Pharmacist (Rheumatology and Pulmonology)

## 2023-07-08 NOTE — Patient Instructions (Signed)
 Vaccines You are taking a medication(s) that can suppress your immune system.  The following immunizations are recommended: Flu annually Covid-19  Td/Tdap (tetanus, diphtheria, pertussis) every 10 years Pneumonia (Prevnar 15 then Pneumovax 23 at least 1 year apart.  Alternatively, can take Prevnar 20 without needing additional dose) Shingrix: 2 doses from 4 weeks to 6 months apart  Please check with your PCP to make sure you are up to date.   Because you are taking Harriette Ohara, Rinvoq, or Olumiant, it is very important to know that this class of medications has a FDA BLACK BOX WARNING for major adverse cardiovascular events (MACE), thrombosis, mortality (including sudden cardiovascular death), serious infections, and lymphomas. MACE is defined as cardiovascular death, myocardial infarction, and stroke. Thrombosis includes deep venous thrombosis (DVT), pulmonary embolism (PE), and arterial thrombosis. If you are a current or former smoker, you are at higher risk for MACE.

## 2023-07-09 LAB — SEDIMENTATION RATE: Sed Rate: 36 mm/h — ABNORMAL HIGH (ref 0–20)

## 2023-07-09 LAB — COMPREHENSIVE METABOLIC PANEL WITH GFR
AG Ratio: 1 (calc) (ref 1.0–2.5)
ALT: 21 U/L (ref 6–29)
AST: 23 U/L (ref 10–35)
Albumin: 3.8 g/dL (ref 3.6–5.1)
Alkaline phosphatase (APISO): 25 U/L — ABNORMAL LOW (ref 31–125)
BUN: 8 mg/dL (ref 7–25)
CO2: 26 mmol/L (ref 20–32)
Calcium: 8.8 mg/dL (ref 8.6–10.2)
Chloride: 103 mmol/L (ref 98–110)
Creat: 0.62 mg/dL (ref 0.50–0.99)
Globulin: 3.9 g/dL — ABNORMAL HIGH (ref 1.9–3.7)
Glucose, Bld: 80 mg/dL (ref 65–99)
Potassium: 4.5 mmol/L (ref 3.5–5.3)
Sodium: 135 mmol/L (ref 135–146)
Total Bilirubin: 0.5 mg/dL (ref 0.2–1.2)
Total Protein: 7.7 g/dL (ref 6.1–8.1)
eGFR: 111 mL/min/{1.73_m2} (ref >=60)

## 2023-07-09 LAB — CBC WITH DIFFERENTIAL/PLATELET
Absolute Lymphocytes: 961 {cells}/uL (ref 850–3900)
Absolute Monocytes: 524 {cells}/uL (ref 200–950)
Basophils Absolute: 19 {cells}/uL (ref 0–200)
Basophils Relative: 0.5 %
Eosinophils Absolute: 38 {cells}/uL (ref 15–500)
Eosinophils Relative: 1 %
HCT: 38.5 % (ref 35.0–45.0)
Hemoglobin: 12.4 g/dL (ref 11.7–15.5)
MCH: 29.5 pg (ref 27.0–33.0)
MCHC: 32.2 g/dL (ref 32.0–36.0)
MCV: 91.7 fL (ref 80.0–100.0)
MPV: 9.8 fL (ref 7.5–12.5)
Monocytes Relative: 13.8 %
Neutro Abs: 2257 {cells}/uL (ref 1500–7800)
Neutrophils Relative %: 59.4 %
Platelets: 325 10*3/uL (ref 140–400)
RBC: 4.2 10*6/uL (ref 3.80–5.10)
RDW: 13.2 % (ref 11.0–15.0)
Total Lymphocyte: 25.3 %
WBC: 3.8 10*3/uL (ref 3.8–10.8)

## 2023-07-10 ENCOUNTER — Ambulatory Visit (INDEPENDENT_AMBULATORY_CARE_PROVIDER_SITE_OTHER): Payer: Medicare Other | Admitting: Neurology

## 2023-07-10 ENCOUNTER — Encounter: Payer: Self-pay | Admitting: Neurology

## 2023-07-10 ENCOUNTER — Telehealth: Payer: Self-pay

## 2023-07-10 VITALS — BP 124/76 | HR 86 | Ht 61.0 in | Wt 207.0 lb

## 2023-07-10 DIAGNOSIS — R519 Headache, unspecified: Secondary | ICD-10-CM

## 2023-07-10 DIAGNOSIS — H539 Unspecified visual disturbance: Secondary | ICD-10-CM

## 2023-07-10 DIAGNOSIS — M542 Cervicalgia: Secondary | ICD-10-CM | POA: Diagnosis not present

## 2023-07-10 DIAGNOSIS — G932 Benign intracranial hypertension: Secondary | ICD-10-CM | POA: Diagnosis not present

## 2023-07-10 DIAGNOSIS — Z79899 Other long term (current) drug therapy: Secondary | ICD-10-CM

## 2023-07-10 DIAGNOSIS — M05741 Rheumatoid arthritis with rheumatoid factor of right hand without organ or systems involvement: Secondary | ICD-10-CM

## 2023-07-10 MED ORDER — TOPIRAMATE 25 MG PO TABS: 25.0000 mg | ORAL_TABLET | Freq: Every day | ORAL | 5 refills | Status: DC

## 2023-07-10 NOTE — Telephone Encounter (Addendum)
 SAVED a Prior Authorization request to [facility] for RINVOQ via CoverMyMeds. Submission pending OV note to be signed  Key: [facility]   ----- Message from Murrell Redden sent at 07/08/2023 11:35 AM EDT ----- Pending OV note from today, please start Rinvoq BIV for RA, HS, atopic dermatitis. Dose : 15mg  once daily

## 2023-07-10 NOTE — Patient Instructions (Addendum)
 I saw you today for head pressure and vision changes.  Your symptoms may be due to increase pressure called idiopathic intracranial hypertension, or IIH for short. Migraine is also possible, but you do not have those classic signs.  I am starting a medication that can treat IIH or migraine called Topamax. It is a very low dose, 25 mg daily.  For B12 being low, I recommend you take B12 1000 mcg daily. If this is in your B complex, then that is fine, but I generally don't recommend B complex as it can make your B6 too high and cause problems.  Continue vitamin D supplementation, CoQ10, and magnesium.  I will see you back in clinic in 3-4 months to re-evaluate your symptoms. Please let me know if you have any questions or concerns in the meantime.   The physicians and staff at [facility] are committed to providing excellent care. You may receive a survey requesting feedback about your experience at our office. We strive to receive "very good" responses to the survey questions. If you feel that your experience would prevent you from giving the office a "very good " response, please contact our office to try to remedy the situation. We may be reached at [phone]. Thank you for taking the time out of your busy day to complete the survey.  Jacquelyne Balint, MD [facility]  I will also give you more information about migraines in case this is the cause of your symptoms: More migraine information: Be aware of common food triggers:  - Caffeine:  coffee, black tea, cola, Mt. Dew  - Chocolate  - Dairy:  aged cheeses (brie, blue, cheddar, gouda, [place], provolone, [place], Swiss, etc), chocolate milk, buttermilk, sour cream, limit eggs and yogurt  - Nuts, peanut butter  - Alcohol  - Cereals/grains:  FRESH breads (fresh bagels, sourdough, doughnuts), yeast productions  - Processed/canned/aged/cured meats (pre-packaged deli meats, hotdogs)  - MSG/glutamate:  soy sauce, flavor enhancer,  pickled/preserved/marinated foods  - Sweeteners:  aspartame (Equal, Nutrasweet).  Sugar and Splenda are okay  - Vegetables:  legumes (lima beans, lentils, snow peas, fava beans, pinto peans, peas, garbanzo beans), sauerkraut, onions, olives, pickles  - Fruit:  avocados, bananas, citrus fruit (orange, lemon, grapefruit), mango  - Other:  Frozen meals, macaroni and cheese Routine exercise Stay adequately hydrated (aim for 64 oz water daily) Keep headache diary Maintain proper stress management Maintain proper sleep hygiene Do not skip meals Consider supplements:  magnesium citrate 400mg  daily, riboflavin 400mg  daily, coenzyme Q10 100mg  three times daily.

## 2023-07-17 ENCOUNTER — Other Ambulatory Visit: Payer: Self-pay | Admitting: Pharmacist

## 2023-07-17 ENCOUNTER — Other Ambulatory Visit (HOSPITAL_COMMUNITY): Payer: Self-pay

## 2023-07-17 MED ORDER — RINVOQ 15 MG PO TB24
15.0000 mg | ORAL_TABLET | Freq: Every day | ORAL | 0 refills | Status: DC
  Filled 2023-07-23: qty 30, 30d supply, fill #0
  Filled 2023-08-13 (×2): qty 30, 30d supply, fill #1
  Filled 2023-09-23 – 2023-09-27 (×2): qty 30, 30d supply, fill #2

## 2023-07-17 NOTE — Telephone Encounter (Signed)
 Received notification from [facility] MEDICAID regarding a prior authorization for [facility]. Authorization has been APPROVED from 07/17/23 to 01/16/24. Approval letter sent to scan center.  Copay for 30 days supply should be $4  Patient can fill through [facility]: [phone]   Authorization #  [id number]  Rx sent to [facility] for onboarding.  Geraldene Kleine, PharmD, MPH, BCPS, CPP Clinical Pharmacist (Rheumatology and Pulmonology)

## 2023-07-17 NOTE — Telephone Encounter (Signed)
 Urgent PA for Rinvoq submitted  Key: [facility]

## 2023-07-17 NOTE — Progress Notes (Signed)
 Patient counseled about Rinvoq at office visit with Dr. Rodell Citrin on 07/08/2023. Please see CPP note from this date  Geraldene Kleine, PharmD, MPH, BCPS, CPP Clinical Pharmacist (Rheumatology and Pulmonology)

## 2023-07-22 ENCOUNTER — Other Ambulatory Visit (HOSPITAL_COMMUNITY): Payer: Self-pay

## 2023-07-22 ENCOUNTER — Encounter: Payer: Self-pay | Admitting: Internal Medicine

## 2023-07-23 ENCOUNTER — Other Ambulatory Visit: Payer: Self-pay

## 2023-07-23 ENCOUNTER — Other Ambulatory Visit (HOSPITAL_COMMUNITY): Payer: Self-pay

## 2023-07-23 NOTE — Progress Notes (Signed)
 Specialty Pharmacy Initial Fill Coordination Note  Jeanne Bush is a 48 y.o. female contacted today regarding initial fill of specialty medication(s) Upadacitinib  (Rinvoq )   Patient requested Delivery   Delivery date: 07/26/23   Verified address: [address]   [place] [address]   Medication will be filled on 07/25/23.   Patient is aware of $4 copayment. She was previously filling Humira  with [facility] and states that she already has her card on file.

## 2023-07-25 ENCOUNTER — Other Ambulatory Visit: Payer: Self-pay

## 2023-07-25 ENCOUNTER — Other Ambulatory Visit (HOSPITAL_COMMUNITY): Payer: Self-pay

## 2023-08-09 ENCOUNTER — Other Ambulatory Visit: Payer: Self-pay

## 2023-08-11 ENCOUNTER — Other Ambulatory Visit: Payer: Self-pay | Admitting: Internal Medicine

## 2023-08-11 DIAGNOSIS — M05741 Rheumatoid arthritis with rheumatoid factor of right hand without organ or systems involvement: Secondary | ICD-10-CM

## 2023-08-11 DIAGNOSIS — M351 Other overlap syndromes: Secondary | ICD-10-CM

## 2023-08-13 ENCOUNTER — Other Ambulatory Visit: Payer: Self-pay

## 2023-08-13 ENCOUNTER — Other Ambulatory Visit (HOSPITAL_COMMUNITY): Payer: Self-pay

## 2023-08-13 NOTE — Progress Notes (Signed)
 Specialty Pharmacy Refill Coordination Note  Jeanne Bush is a 48 y.o. female contacted today regarding refills of specialty medication(s) Rinvoq .  Patient requested (Patient-Rptd) Delivery   Delivery date: (Patient-Rptd) 08/30/23   Verified address: (Patient-Rptd) [address] [place], [place] [address]   Medication will be filled on 08/29/23.

## 2023-08-29 ENCOUNTER — Other Ambulatory Visit: Payer: Self-pay

## 2023-09-04 ENCOUNTER — Other Ambulatory Visit (HOSPITAL_COMMUNITY): Payer: Self-pay

## 2023-09-10 ENCOUNTER — Other Ambulatory Visit: Payer: Self-pay

## 2023-09-10 ENCOUNTER — Other Ambulatory Visit: Payer: Self-pay | Admitting: Internal Medicine

## 2023-09-10 DIAGNOSIS — M05741 Rheumatoid arthritis with rheumatoid factor of right hand without organ or systems involvement: Secondary | ICD-10-CM

## 2023-09-10 DIAGNOSIS — M351 Other overlap syndromes: Secondary | ICD-10-CM

## 2023-09-10 MED ORDER — PREDNISONE 5 MG PO TABS: 5.0000 mg | ORAL_TABLET | Freq: Every day | ORAL | 0 refills | Status: DC

## 2023-09-10 NOTE — Telephone Encounter (Signed)
 Last Fill: 06/03/2023  Next Visit: 10/07/2023  Last Visit: 07/08/2023  Dx: Rheumatoid arthritis involving both hands with positive rheumatoid factor (HCC)   Current Dose per office note on 07/08/2023: not mentioned  Okay to refill Prednisone ?

## 2023-09-11 ENCOUNTER — Other Ambulatory Visit: Payer: Self-pay

## 2023-09-11 ENCOUNTER — Encounter: Payer: Self-pay | Admitting: Internal Medicine

## 2023-09-11 DIAGNOSIS — M05741 Rheumatoid arthritis with rheumatoid factor of right hand without organ or systems involvement: Secondary | ICD-10-CM

## 2023-09-11 DIAGNOSIS — M351 Other overlap syndromes: Secondary | ICD-10-CM

## 2023-09-11 NOTE — Telephone Encounter (Signed)
 Last Fill: 11/30/2022  Labs: 07/08/2023 Globulin 3.9 Alkaline phosphatase 25 CBC WNL   Next Visit: 10/07/2023  Last Visit: 07/08/2023  DX: mixed connective tissue disease/RA   Current Dose per office note 07/08/2023: methotrexate  20 mg subcu weekly   Okay to refill Methotrexate ?

## 2023-09-13 MED ORDER — METHOTREXATE SODIUM CHEMO INJECTION 50 MG/2ML: 20.0000 mg | INTRAMUSCULAR | 0 refills | Status: DC

## 2023-09-23 ENCOUNTER — Other Ambulatory Visit: Payer: Self-pay

## 2023-09-23 NOTE — Progress Notes (Signed)
 Office Visit Note  Patient: Jeanne Bush             Date of Birth: 1975-08-09           MRN: [id number]             PCP: Dyana Vikki SAILOR, DO Referring: Dyana Vikki SAILOR, DO Visit Date: 10/07/2023   Subjective:  Follow-up (Patient states he fingers have been hurting her more and her chest muscles are still very tight. )   Discussed the use of AI scribe software for clinical note transcription with the patient, who gave verbal consent to proceed.  History of Present Illness   Jeanne Bush is a 48 y.o. female here for follow up with mixed connective tissue disease/RA overlap on methotrexate  20 mg subcu weekly, folic acid  1 mg daily, and rinvoq  15 mg daily.  We switched treatment from adalimumab  to Rinvoq  after last visit due to concern about worsening headaches possibly intracranial hypertension related symptoms with associated visual change.  Joint pain is described as 'really achy lately,' particularly affecting the hips, shoulders, and muscles across the chest. Morning stiffness lasts about an hour. They are uncertain if the change in medication has affected their symptoms.  Their skin condition is improving, with significant drainage in the gluteal area two weeks ago now healed. No new areas of concern in axillae have developed since they stopped shaving.  They experience soreness in their back muscles, extending down the sides of their legs, and a tingling sensation that occasionally travels up their neck. Neck exercises provide relief. They also note that their foot tends to kick out into external rotation when walking, which they try to correct.  They experience headaches characterized by a sensation of pressure, akin to someone pushing on their head. The strobing effect, described as a 'seventies disco effect,' has resolved, but they continue to have a fluttering sensation at the edges of their vision. A new symptom is a liquid sensation in their head.   Previous  HPI 07/08/2023 Jeanne Bush is a 48 y.o. female here for follow up with mixed connective tissue disease/RA overlap on methotrexate  20 mg subcu weekly, folic acid  1 mg daily, and adalimumab  40 mg subcu q. 14 days.     She has been experiencing worsening visual symptoms since starting Humira , describing a sensation of pressure in her head, particularly around the optic nerves. She has not yet seen a neurologist for these symptoms but has an appointment scheduled soon. A lumbar puncture performed a few years ago showed elevated intracranial pressure, though not significantly high.   Persistent skin rashes have not improved with Humira . The rashes are described as scabbed over and draining. Additionally, there has been no significant improvement in joint pain or stiffness since starting the medication.   Her past medical history includes a previous episode of shingles following chickenpox in childhood and eczema, particularly on her hands and legs. Her husband has a history of severe diverticulitis, but she does not.   No improvement in skin rashes, joint pain, or stiffness with Humira . No history of severe diverticulitis. No cardiovascular disease or history of blood clots. No history of strokes or heart attacks.         Has lipid panel 05/15/23   Previous HPI 04/09/2023 Jeanne Bush is a 48 y.o. female here for follow up with mixed connective tissue disease/RA overlap on methotrexate  20 mg subcu weekly, folic acid  1 mg daily, and adalimumab  40 mg subcu q. 14 days. She  has been on Humira  for approximately two months now. They report no significant improvement in joint pain or skin rashes, but note that the lesion on the buttocks appears to be scabbing over.   The patient also reports a persistent cluster headache, which began shortly after the initiation of Humira . The headache is characterized by a sensation of pressure at the back of the eyes and a kaleidoscope effect on the periphery of their  vision. This has been ongoing for about three weeks, which is significantly longer than their usual headache duration of one to two days. The patient denies any associated pain, palpitations, or sinus symptoms.   The patient is also on prednisone , methotrexate , and a muscle relaxer. They have noticed some weight gain since their last visit, which they attribute to the prednisone . They are also in the process of switching insurance due to approval of disability, and plan to see an optometrist or ophthalmologist once this is finalized.   The patient denies any major flare-ups or swollen areas since the last visit. They report that joint pain has either been normal or slightly improved, but muscle pain persists. They also mention a persistent rash in the groin area.      Previous HPI 01/31/2023 Jeanne Bush is a 48 y.o. female here for follow up with mixed connective tissue disease/RA overlap on methotrexate  20 mg subcu weekly and folic acid  1 mg daily.  Since our last visit joint pain and stiffness is doing pretty well but remains on 5 mg prednisone  daily.  She was able to see dermatology with [facility] for skin biopsy that confirmed hidradenitis.  Stillness we discussed previous recommendation for biologic DMARD treatment here to discuss medication and needs baseline laboratory testing.   Previous HPI 11/30/2022 Jeanne Bush is a 48 y.o. female here for follow up with mixed connective tissue disease/RA overlap on methotrexate  20 mg subcu weekly and folic acid  1 mg daily.  After finishing the previous prednisone  course developed a significant increase in joint pain and stiffness and muscular pain throughout upper and lower extremities.  Especially sore in her fingers and wrists.  Resuming prednisone  at 5 mg daily helped a lot with these musculoskeletal symptoms.  Still having issues with chronic fatigue and brain fog type concentration difficulty.  She is having some more indigestion due to stopping her  omeprazole at recommendations of the pharmacist with starting methotrexate .  Also found to have a rash on her buttocks and gluteal region is recommended to see dermatology for suspicion of possible hidradenitis suppurativa.  No previous history of similar chronic skin lesions no involvement in the axilla.   Previous HPI 08/29/2022 Jeanne Bush is a 48 y.o. female here for follow up with mixed connective tissue disease/RA overlap after resuming methotrexate  20 mg subcu weekly and folic acid  1 mg daily since our visit in March.  She did not notice any appreciable side effect while resuming methotrexate  but is also still having a good amount of symptoms with pain and stiffness and fatigue.  For about the past 1 week he has significant increase in pain especially throughout her chest and back.  This is most noticeable when trying to roll over in bed or with any direct pressure on these areas.  Has not experienced any new peripheral joint swelling.  She is also having more trouble with acid reflux after discontinuing omeprazole at her pharmacist recommendation since starting methotrexate .  This drug was originally recommended based on her ENT with what sounds like laryngal pharyngeal  reflux symptoms. Does not also complaining of feeling excessively dry all the time despite drinking a lot of water daily and frequent urination.  Not specifically dryness of the eyes or mouth although she does get benefit using the eyedrops.   Previous HPI 06/25/22 Jeanne Bush is a 48 y.o. 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 Sherrill at [facility]. She was on subcutaneous methotrexate  treatment and cymbalta, lyrica, and voltaren for symptoms due to arthritis and the fibromyalgia. Labs positive for ANA, RNP, and RF. She has had some shortness of breath and pulmonary studies indicative for  small airways disease with no pulmonary fibrosis on previous CT chest exam. History of eczema treated with cellcept and methotrexate , had previous biopsy for this in 2014. Hydroxychloroquine was stopped due to increasing dizziness symptoms.  She has now been off methotrexate  due to lack of clinical follow-up after she lost the insurance.  Off the medication has experienced some increase in joint pain and stiffness as well as eczematous rashes. She experiences some swelling as well as pain and stiffness in both hands.  Had carpal tunnel release surgery last year without any great relief of symptoms and is still getting problems with the hand and wrist pains and sometimes numbness.  Does not see much swelling in her other joints.  She pretty often experiences pain across the middle and upper chest region this had been suspected as pleurisy by some previous providers no particular diagnostic testing or findings associated with this. She has longstanding history of Raynaud's symptoms not associated with any ulcers or digital pitting. Eczema skin rashes on the chest are new since.  Previously had axillary skin lesions questionable for hidradenitis suppurativa but these pretty much resolved with stopping shaving. She originally had onset of chronic joint pain and sensitivity since she was a teenager.  The symptoms were attributed as growing pains but the generalized pain and sensitivity she has now remained similar to what she experienced at that time.  She gets sensitivity to touch pretty much all over on a daily basis.  Also with persistent brain fog and fatigue.  She has chronic mild constipation symptoms but without any serious GI complications.       Labs reviewed 2019 SSA pos U1RNP >150   07/2016 ANA pos RNP pos RF 36 HCV neg HBV neg   02/25/19 HRCT Chest w/o Contrast No pulmonary fibrosis   Review of Systems  Constitutional:  Positive for fatigue.  HENT:  Positive for mouth dryness.  Negative for mouth sores.   Eyes:  Positive for dryness.  Respiratory:  Positive for shortness of breath.   Cardiovascular:  Positive for chest pain and palpitations.  Gastrointestinal:  Negative for blood in stool, constipation and diarrhea.  Endocrine: Negative for increased urination.  Genitourinary:  Negative for involuntary urination.  Musculoskeletal:  Positive for joint pain, joint pain, joint swelling, myalgias, muscle weakness, morning stiffness, muscle tenderness and myalgias. Negative for gait problem.  Skin:  Positive for hair loss and sensitivity to sunlight. Negative for color change and rash.  Allergic/Immunologic: Negative for susceptible to infections.  Neurological:  Positive for dizziness and headaches.  Hematological:  Negative for swollen glands.  Psychiatric/Behavioral:  Negative for depressed mood and sleep disturbance. The patient is not nervous/anxious.     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   Mixed connective tissue disease (HCC) 03/08/2022   Raynaud's disease without gangrene 03/08/2022   Myopia of both eyes with astigmatism 01/15/2017   Plaquenil causing adverse effect in therapeutic use 01/15/2017   Rheumatoid arthritis involving both hands with positive rheumatoid factor (HCC) 10/12/2016   Allergic rhinitis 08/10/2013   Gastro-esophageal reflux disease without esophagitis 06/15/2013   Hypothyroidism 06/15/2013   Atopic dermatitis 06/15/2013   Obesity 06/15/2013   Plantar fascial fibromatosis 06/15/2013   Pure hyperglyceridemia 06/15/2013   Palpitations 01/07/2013   Sinus tachycardia 01/06/2013   Low back pain 11/18/2012    Past Medical History:  Diagnosis  Date   Hypertension    Hyperthyroidism    Mixed connective tissue disease (HCC)    Rheumatoid arthritis (HCC)    Tachycardia     Family History  Problem Relation Age of Onset   Thyroid  disease Mother    Arthritis Father    Atrial fibrillation Father    Congestive Heart Failure Father    Breast cancer Maternal Aunt    Past Surgical History:  Procedure Laterality Date   BILATERAL CARPAL TUNNEL RELEASE Bilateral    CESAREAN SECTION     CESAREAN SECTION     TUBAL LIGATION     Social History   Social History Narrative   Are you right handed or left handed? Right   Are you currently employed ? 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 no   Who lives with you? family   What type of home do you live in: 1 story or 2 story? three   Caffiene one cup in AM    Immunization History  Administered Date(s) Administered   Influenza,inj,Quad PF,6+ Mos 01/07/2013, 01/31/2017, 01/15/2019, 04/07/2020, 03/08/2022   Influenza-Unspecified 02/13/2017   Pneumococcal Conjugate-13 06/11/2017   Tdap 01/18/2005, 08/10/2015     Objective: Vital Signs: BP 115/78 (BP Location: Left Arm, Patient Position: Sitting, Cuff Size: Large)   Pulse 88   Resp 14   Ht 5' 1 (1.549 m)   Wt 203 lb (92.1 kg)   LMP 10/07/2023   BMI 38.36 kg/m    Physical Exam Constitutional:      Appearance: She is obese.  Eyes:     Conjunctiva/sclera: Conjunctivae normal.  Cardiovascular:     Rate and Rhythm: Normal rate and regular rhythm.  Pulmonary:     Effort: Pulmonary effort is normal.     Breath sounds: Normal breath sounds.  Musculoskeletal:     Right lower leg: No edema.     Left lower leg: No edema.  Skin:    General: Skin is warm and dry.  Neurological:     Mental Status: She is alert.  Psychiatric:        Mood and Affect: Mood normal.      Musculoskeletal Exam:  Neck tightness, including anterior tightness and tightness on rotation and movement.  Shoulders  full ROM no tenderness or swelling Elbows full ROM no tenderness or swelling Wrists full ROM no tenderness or swelling Fingers full ROM, left MCP joint tenderness to pressure, no synovitis Knees full ROM no tenderness or swelling  Investigation: No additional findings.  Imaging: No results found.  Recent Labs: Lab Results  Component Value Date   WBC 3.8  07/08/2023   HGB 12.4 07/08/2023   PLT 325 07/08/2023   NA 135 07/08/2023   K 4.5 07/08/2023   CL 103 07/08/2023   CO2 26 07/08/2023   GLUCOSE 80 07/08/2023   BUN 8 07/08/2023   CREATININE 0.62 07/08/2023   BILITOT 0.5 07/08/2023   ALKPHOS 25 (L) 03/12/2023   AST 23 07/08/2023   ALT 21 07/08/2023   PROT 7.7 07/08/2023   ALBUMIN 3.5 03/12/2023   CALCIUM 8.8 07/08/2023   GFRAA >60 08/18/2016   QFTBGOLDPLUS NEGATIVE 01/31/2023    Speciality Comments: Humira  started 02/25/23  Procedures:  No procedures performed Allergies: Hydroxychloroquine   Assessment / Plan:     Visit Diagnoses: Rheumatoid arthritis involving both hands with positive rheumatoid factor (HCC) - Plan: Sedimentation rate, Upadacitinib  ER (RINVOQ ) 15 MG TB24 Inflammatory arthritis appears been well-controlled.  Has joint pain and stiffness in multiple areas but I think more related to muscular pain throughout the paraspinal muscles extending all the way from neck down to the hip and IT band in her hips.  Left hand pain might be consistent with RA but there is not significant synovitis or restricted range of motion on exam. - Checking sed rate for disease activity monitoring - Continue methotrexate  20 mg subcu weekly folic acid  1 mg daily - Continue Rinvoq  15 mg p.o. daily - Prednisone  5 mg daily, if inflammatory labs also improve could try taper or discontinue otherwise maintain at current dose  High risk medication use - Plan switch to rinvoq  15 mg daily, methotrexate  20 mg subcu weekly - Plan: CBC with Differential/Platelet, Comprehensive metabolic panel  with GFR, Lipid panel, Upadacitinib  ER (RINVOQ ) 15 MG TB24 Appears to be tolerating medications well.  No serious interval infections.  Headache related problems seem partially better at least the abnormal visual symptoms. - Checking CBC and CMP for medication monitoring continue long-term use of methotrexate  and Rinvoq  - Rechecking lipid panel after starting Rinvoq   Myofascial pain - On Lyrica and Cymbalta for this already  Muscle tightness and soreness Muscle tightness and soreness in chest and back due to compensatory posture and alignment issues. - Advise stretching exercises for hip flexors and low back, printed handouts provided.  Headaches Change in headache pattern with visual disturbances. Strobing effect resolved, visual fluttering persists. - Evaluate for neurological causes. - Consider neurology referral if symptoms persist.  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 Rinvoq  may benefit alopecia so probably not related to this.    Orders: Orders Placed This Encounter  Procedures   Sedimentation rate   CBC with Differential/Platelet   Comprehensive metabolic panel with GFR   Lipid panel   Meds ordered this encounter  Medications   Upadacitinib  ER (RINVOQ ) 15 MG TB24    Sig: Take 1 tablet (15 mg total) by mouth daily.    Dispense:  90 tablet    Refill:  0     Follow-Up Instructions: Return in about 3 months (around 01/07/2024) for RA on UPA/MTX/GC f/u 3mos.   Lonni LELON Ester, MD  Note - This record has been created using [employer].  Chart creation errors have been sought, but may not always  have been located. Such creation errors do not reflect on  the standard of medical care.

## 2023-09-27 ENCOUNTER — Encounter (INDEPENDENT_AMBULATORY_CARE_PROVIDER_SITE_OTHER): Payer: Self-pay

## 2023-09-27 ENCOUNTER — Other Ambulatory Visit: Payer: Self-pay

## 2023-09-27 NOTE — Progress Notes (Signed)
 Specialty Pharmacy Refill Coordination Note  Jeanne Bush is a 48 y.o. female contacted today regarding refills of specialty medication(s) Upadacitinib  (Rinvoq )   Patient requested Delivery   Delivery date: 09/30/23   Verified address: (Patient-Rptd) [address] [place], [place] [address]   Medication will be filled on 06.27.25.

## 2023-10-07 ENCOUNTER — Ambulatory Visit: Attending: Internal Medicine | Admitting: Internal Medicine

## 2023-10-07 ENCOUNTER — Other Ambulatory Visit: Payer: Self-pay

## 2023-10-07 ENCOUNTER — Encounter: Payer: Self-pay | Admitting: Internal Medicine

## 2023-10-07 VITALS — BP 115/78 | HR 88 | Resp 14 | Ht 61.0 in | Wt 203.0 lb

## 2023-10-07 DIAGNOSIS — Z79899 Other long term (current) drug therapy: Secondary | ICD-10-CM | POA: Diagnosis not present

## 2023-10-07 DIAGNOSIS — M7918 Myalgia, other site: Secondary | ICD-10-CM

## 2023-10-07 DIAGNOSIS — Z7952 Long term (current) use of systemic steroids: Secondary | ICD-10-CM | POA: Diagnosis not present

## 2023-10-07 DIAGNOSIS — M05741 Rheumatoid arthritis with rheumatoid factor of right hand without organ or systems involvement: Secondary | ICD-10-CM | POA: Diagnosis not present

## 2023-10-07 DIAGNOSIS — M05742 Rheumatoid arthritis with rheumatoid factor of left hand without organ or systems involvement: Secondary | ICD-10-CM

## 2023-10-07 DIAGNOSIS — L732 Hidradenitis suppurativa: Secondary | ICD-10-CM

## 2023-10-07 MED ORDER — RINVOQ 15 MG PO TB24
15.0000 mg | ORAL_TABLET | Freq: Every day | ORAL | 0 refills | Status: DC
  Filled 2023-10-07: qty 90, 90d supply, fill #0
  Filled 2023-10-28: qty 30, 30d supply, fill #0
  Filled 2023-11-21: qty 30, 30d supply, fill #1
  Filled 2023-12-23 – 2023-12-24 (×2): qty 30, 30d supply, fill #2

## 2023-10-08 LAB — COMPREHENSIVE METABOLIC PANEL WITH GFR
AG Ratio: 1.1 (calc) (ref 1.0–2.5)
ALT: 16 U/L (ref 6–29)
AST: 17 U/L (ref 10–35)
Albumin: 4 g/dL (ref 3.6–5.1)
Alkaline phosphatase (APISO): 22 U/L — ABNORMAL LOW (ref 31–125)
BUN: 10 mg/dL (ref 7–25)
CO2: 22 mmol/L (ref 20–32)
Calcium: 8.8 mg/dL (ref 8.6–10.2)
Chloride: 105 mmol/L (ref 98–110)
Creat: 0.75 mg/dL (ref 0.50–0.99)
Globulin: 3.5 g/dL (ref 1.9–3.7)
Glucose, Bld: 93 mg/dL (ref 65–99)
Potassium: 4.3 mmol/L (ref 3.5–5.3)
Sodium: 137 mmol/L (ref 135–146)
Total Bilirubin: 0.4 mg/dL (ref 0.2–1.2)
Total Protein: 7.5 g/dL (ref 6.1–8.1)
eGFR: 99 mL/min/1.73m2 (ref >=60)

## 2023-10-08 LAB — CBC WITH DIFFERENTIAL/PLATELET
Absolute Lymphocytes: 793 {cells}/uL — ABNORMAL LOW (ref 850–3900)
Absolute Monocytes: 677 {cells}/uL (ref 200–950)
Basophils Absolute: 18 {cells}/uL (ref 0–200)
Basophils Relative: 0.3 %
Eosinophils Absolute: 43 {cells}/uL (ref 15–500)
Eosinophils Relative: 0.7 %
HCT: 39.5 % (ref 35.0–45.0)
Hemoglobin: 12.6 g/dL (ref 11.7–15.5)
MCH: 30.8 pg (ref 27.0–33.0)
MCHC: 31.9 g/dL — ABNORMAL LOW (ref 32.0–36.0)
MCV: 96.6 fL (ref 80.0–100.0)
MPV: 9.7 fL (ref 7.5–12.5)
Monocytes Relative: 11.1 %
Neutro Abs: 4569 {cells}/uL (ref 1500–7800)
Neutrophils Relative %: 74.9 %
Platelets: 321 Thousand/uL (ref 140–400)
RBC: 4.09 Million/uL (ref 3.80–5.10)
RDW: 15.8 % — ABNORMAL HIGH (ref 11.0–15.0)
Total Lymphocyte: 13 %
WBC: 6.1 Thousand/uL (ref 3.8–10.8)

## 2023-10-08 LAB — LIPID PANEL
Cholesterol: 198 mg/dL (ref <200)
HDL: 44 mg/dL — ABNORMAL LOW (ref >=50)
LDL Cholesterol (Calc): 118 mg/dL — ABNORMAL HIGH
Non-HDL Cholesterol (Calc): 154 mg/dL — ABNORMAL HIGH (ref <130)
Total CHOL/HDL Ratio: 4.5 (calc) (ref <5.0)
Triglycerides: 255 mg/dL — ABNORMAL HIGH (ref <150)

## 2023-10-08 LAB — SEDIMENTATION RATE: Sed Rate: 14 mm/h (ref 0–20)

## 2023-10-17 ENCOUNTER — Encounter: Payer: Self-pay | Admitting: Internal Medicine

## 2023-10-26 ENCOUNTER — Encounter (INDEPENDENT_AMBULATORY_CARE_PROVIDER_SITE_OTHER): Payer: Self-pay

## 2023-10-28 ENCOUNTER — Other Ambulatory Visit: Payer: Self-pay

## 2023-10-28 NOTE — Progress Notes (Signed)
 Specialty Pharmacy Refill Coordination Note  Jeanne Bush is a 48 y.o. female contacted today regarding refills of specialty medication(s) Upadacitinib  (Rinvoq )   Patient requested Delivery   Delivery date: 10/30/23   Verified address: [address] [place], [place] [address]   Medication will be filled on 10/29/23.

## 2023-10-31 NOTE — Progress Notes (Signed)
 NEUROLOGY FOLLOW UP OFFICE NOTE  Estie Sproule [id number]  Subjective:  Capri Veals is a 48 y.o. 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  To briefly review: 07/10/23: Patient's symptoms started at least couple of years ago. She feels a constant pressure on the top of her head. It gets worse with increased pressure due to weather (storming weather is worse). She has ringing in her ear that is worse with stormy weather as well. She has poor smell. She does not have significant pain in her head, like pounding etc. She has some associated vision changes as well. She has kaleidoscope effect in her eyes and darkening of vision. She sometimes has a strobe light sensation in her eyes, but this seemed to be associated with Humira . She gets the kaleidoscope effect a couple of times a month. She also feels there is a shadow in her lower vision. She also feels like she can see a beating of a pulse in her eyes and feel it in her face. Changing head positions or walking a lot will increase this.    She thinks the pressure sensation gets worse when standing and is not worse with laying down. She has pain in the neck and back of neck. She denies photophobia, phonophobia, or nausea.   Patient saw neurology at [facility] in 02/2020 for symptoms and migraine vs IIH was suspected. LP on 05/06/20 showed an opening pressure of 23 (only 9 cc removed due to poor flow). She had an MRI in 05/2023 that showed a partially empty sella and sinus thickening.   She has never been on medication for her symptoms. Patient recently stopped Humira . She is on methotrexate  and prednisone  10 mg daily for RA and Sjogren's currently. She takes Lyrica and flexeril  for fibromyalgia. She takes propranolol 10 mg TID for tachycardia.   She  takes vit B complex, vit D, magnesium, and CoQ10, and multivitamin.   Weight changes: No significant changes Caffeine use: 1 cup of coffee per day, no soda, no tea, no energy drinks EtOH use: Very rare Smoker: former, but remote OCP use: No, tubes tied Family history of neurologic disease, including headaches: Son with Becker's MD, autism, and headaches  Most recent Assessment and Plan (07/10/23): Azyriah Temkin is a 48 y.o. female who presents for evaluation of head pressure and vision changes. 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 Her neurological examination is essentially normal. Available diagnostic data is significant for LP in 2021 showing an opening pressure of 23 and MRI brain in 05/2023 showing partially empty sella. The opening pressure is borderline and partially empty sella is often a normal MRI finding, however, given her symptoms, it is safer to treat this as idiopathic intracranial hypertension (IIH). She is also on methotrexate  which has some case reports suggesting an association with IIH. Migraine with aura is also possible, though patient does not have significant head pain or migraine like features. Cervicalgia could also be contributing to head pain.   PLAN: -B12 1000 mcg daily supplementation -Continue Vit D supplementation -Continue propranolol 10 mg TID as this could help with headaches -Continue magnesium and CoQ10 as these can help with headaches -Will start Topamax  25 mg daily as this can treat IIH  or migraine -Discussed PT for cervicalgia, but patient would like to defer for now  Since their last visit: She still occasionally sees flashes in her eyes but not as much as prior. She denies vision loss. She went on a trip to a [place]  and felt his ringing in her ears. The pressure in her head has also improved. She will now notice very  mild and short headaches, that will only last 5 minutes and eases up. Overall, things are better, but still bothersome.  Current medications: -Propranolol 10 mg TID -Topamax  25 mg daily -Magnesium and CoQ10 -B12 1000 mcg daily  MEDICATIONS:  Outpatient Encounter Medications as of 11/08/2023  Medication Sig   acetaminophen  (TYLENOL ) 500 MG tablet Take by mouth.   ADVAIR HFA 115-21 MCG/ACT inhaler Inhale 2 puffs into the lungs 2 (two) times daily.   albuterol  (VENTOLIN  HFA) 108 (90 Base) MCG/ACT inhaler Inhale 2 puffs into the lungs every 6 (six) hours as needed.   b complex vitamins capsule Take 1 capsule by mouth daily.   Cyanocobalamin (HM SUPER VITAMIN B12 PO) Take by mouth.   Carboxymethylcellulose Sodium (THERATEARS) 0.25 % SOLN Apply to eye.   Coenzyme Q10 (CO Q 10 PO) Take by mouth.   cyclobenzaprine  (FLEXERIL ) 5 MG tablet Take 1 tablet (5 mg total) by mouth at bedtime as needed for muscle spasms.   DULoxetine (CYMBALTA) 30 MG capsule Take by mouth.   fluocinolone (VANOS) 0.01 % cream Apply topically.   fluticasone (FLONASE) 50 MCG/ACT nasal spray Place 1 spray into the nose daily.   folic acid  (FOLVITE ) 1 MG tablet Take 1 tablet (1 mg total) by mouth daily.   ibuprofen  (ADVIL ) 200 MG tablet Take by mouth.   ibuprofen  (ADVIL ,MOTRIN ) 600 MG tablet Take 1 tablet (600 mg total) by mouth every 6 (six) hours as needed for mild pain or moderate pain.   levothyroxine (SYNTHROID) 137 MCG tablet Take 137 mcg by mouth daily before breakfast.   MAGNESIUM PO Take by mouth.   methotrexate  50 MG/2ML injection Inject 0.8 mLs (20 mg total) into the skin once a week.   montelukast (SINGULAIR) 10 MG tablet Take 10 mg by mouth at bedtime.   Multiple Vitamin (MULTI-VITAMIN) tablet Take 1 tablet by mouth daily.   mupirocin  ointment (BACTROBAN ) 2 % Apply once or twice a day during each bandage change   naproxen (NAPROSYN) 500 MG tablet Take 1 tablet by mouth as needed.   Omega-3 Fatty Acids (FISH OIL  PO) Take by mouth.   omeprazole (PRILOSEC) 40 MG capsule Take 40 mg by mouth in the morning and at bedtime.   ondansetron  (ZOFRAN  ODT) 4 MG disintegrating tablet Take 1 tablet (4 mg total) by mouth every 8 (eight) hours as needed for nausea or vomiting. (Patient not taking: Reported on 10/07/2023)   predniSONE  (DELTASONE ) 5 MG tablet Take 1 tablet (5 mg total) by mouth daily with breakfast.   pregabalin (LYRICA) 100 MG capsule Take 1 capsule by mouth 2 (two) times daily.   propranolol (INDERAL) 10 MG tablet Take 1 tablet by mouth 3 (three) times daily.   topiramate  (TOPAMAX ) 25 MG tablet Take 1 tablet (25 mg total) by mouth daily.   triamcinolone cream (KENALOG) 0.5 % Apply 1 Application topically as needed.   TUBERCULIN SYR 1CC/27GX1/2 (B-D TB SYRINGE 1CC/27GX1/2) 27G X 1/2 1 ML MISC USE TO INJECT METHOTREXATE  UNDER THE SKIN ONCE WEEKLY   Upadacitinib  ER (RINVOQ ) 15 MG TB24 Take 1 tablet (15 mg total) by mouth  daily.   No facility-administered encounter medications on file as of 11/08/2023.    PAST MEDICAL HISTORY: Past Medical History:  Diagnosis Date   Hypertension    Hyperthyroidism    Mixed connective tissue disease (HCC)    Rheumatoid arthritis (HCC)    Tachycardia     PAST SURGICAL HISTORY: Past Surgical History:  Procedure Laterality Date   BILATERAL CARPAL TUNNEL RELEASE Bilateral    CESAREAN SECTION     CESAREAN SECTION     TUBAL LIGATION      ALLERGIES: Allergies  Allergen Reactions   Hydroxychloroquine Palpitations    FAMILY HISTORY: Family History  Problem Relation Age of Onset   Thyroid  disease Mother    Arthritis Father    Atrial fibrillation Father    Congestive Heart Failure Father    Breast cancer Maternal Aunt     SOCIAL HISTORY: Social History   Tobacco Use   Smoking status: Former    Current packs/day: 0.00    Average packs/day: 1 pack/day for 10.0 years (10.0 ttl pk-yrs)    Types: Cigarettes    Start date: 73    Quit date: 49     Years since quitting: 28.6    Passive exposure: Current   Smokeless tobacco: Never  Vaping Use   Vaping status: Never Used  Substance Use Topics   Alcohol use: Yes    Comment: rarely   Drug use: No   Social History   Social History Narrative   Are you right handed or left handed? Right   Are you currently employed ? 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 no   Who lives with you? family   What type of home do you live in: 1 story or 2 story? three   Caffiene one cup in AM       Objective:  Vital Signs:  BP 108/71 (BP Location: Left Arm, Patient Position: Sitting)   Pulse 92   Ht 5' 1 (1.549 m)   Wt 201 lb (91.2 kg)   SpO2 99%   BMI 37.98 kg/m   General: No acute distress.  Patient appears well-groomed.   Head:  Normocephalic/atraumatic Eyes:  fundi examined, disc margins clear, no obvious papilledema Neck: supple, positive for paraspinal tenderness, reduced range of motion Back: No paraspinal tenderness Heart: regular rate and rhythm Lungs: Clear to auscultation bilaterally. Vascular: No carotid bruits.  Neurological Exam: Mental status: alert and oriented, speech fluent and not dysarthric, language intact.  Cranial nerves: CN I: not tested CN II: pupils equal, round and reactive to light, visual fields intact CN III, IV, VI:  full range of motion, no nystagmus, no ptosis CN V: facial sensation intact. CN VII: upper and lower face symmetric CN VIII: hearing intact CN IX, X: uvula midline CN XI: sternocleidomastoid and trapezius muscles intact CN XII: tongue midline  Bulk & Tone: normal, no fasciculations. Motor:  muscle strength 5/5 throughout Deep Tendon Reflexes:  2+ throughout Sensation:  Light touch sensation intact. Finger to nose testing:  Without dysmetria.     Gait:  Normal station and stride.   Labs and Imaging review: New results: 10/07/23: ESR 14 CBC w/ diff unremarkable CMP unremarkable Lipid panel:  tChol 198, LDL 118, TG 255  Previously reviewed results: ESR (04/11/23): 33   03/12/23: CBC unremarkable CMP significant for Na 133, glucose 105   Vit D (01/31/23): 24   External labs: 05/15/23: B12 237 TSH wnl Lipid  panel: tChol 175, LDL 113, TG 68   Imaging/Procedures: MRI brain w/wo contrast (05/21/23): FINDINGS: Brain:   No age-advanced or lobar predominant cerebral atrophy.   Partially empty sella turcica.   No cortical encephalomalacia is identified. No significant cerebral white matter disease.   There is no acute infarct.   No evidence of an intracranial mass.   No chronic intracranial blood products.   No extra-axial fluid collection.   No midline shift.   No pathologic intracranial enhancement identified.   Vascular: Maintained flow voids within the proximal large arterial vessels. Small developmental venous anomaly within the posterior right frontal lobe (anatomic variant).   Skull and upper cervical spine: No focal worrisome marrow lesion.   Sinuses/Orbits: No mass or acute finding within the imaged orbits. Mild mucosal thickening within the left maxillary sinus.   Other: Trace fluid within right mastoid air cells.   IMPRESSION: 1. No evidence of an acute intracranial abnormality. 2. Partially empty sella turcica. This finding can reflect incidental anatomic variation, or alternatively, it can be associated with chronic idiopathic intracranial hypertension (pseudotumor cerebri). 3. Mild left maxillary sinus mucosal thickening. 4. Trace fluid within right mastoid air cells.   External EMG of [facility] - 03/10/2019): Conclusions:  This is an abnormal study.   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   There is no electrophysiologic evidence of a right cubital tunnel  syndrome or ulnar mononeuropathy.   There is also no sonographic  evidence of cubital tunnel syndrome, however there is ulnar nerve  partial subluxation over the medial epicondyle with elbow  flexion.    MRI Cervical spine wo contrast (external - 12/12/2018): Progression of multilevel degenerative changes of the cervical spine with varying degrees of canal stenosis (moderate C6-C7) and foraminal narrowing (moderate right C6-C7), as detailed above.   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 LP in 2021 showed a borderline opening pressure of 23. MRI brain in 05/2023 showed a partially empty sella which can be normal or seen in IIH. Given her symptoms, we decided it was safer to treat as IIH though migraine with aura is also possible. She has responded some to adding topamax  though still has symptoms. She is interested in increasing topamax  today which is reasonable.   Plan: -Continue B12 1000 mcg daily supplementation -Continue Vit D supplementation -Continue propranolol 10 mg TID as this could help with headaches -Continue magnesium and CoQ10 as these can help with headaches -Increase Topamax  to 50 mg daily -Discussed PT for cervicalgia again, but patient would like to defer for now  Return to clinic in 6 months  Total time spent reviewing records, interview, history/exam, documentation, and coordination of care on day of encounter:  35 min  Venetia Potters, MD

## 2023-11-08 ENCOUNTER — Encounter: Payer: Self-pay | Admitting: Neurology

## 2023-11-08 ENCOUNTER — Ambulatory Visit (INDEPENDENT_AMBULATORY_CARE_PROVIDER_SITE_OTHER): Admitting: Neurology

## 2023-11-08 VITALS — BP 108/71 | HR 92 | Ht 61.0 in | Wt 201.0 lb

## 2023-11-08 DIAGNOSIS — M542 Cervicalgia: Secondary | ICD-10-CM | POA: Diagnosis not present

## 2023-11-08 DIAGNOSIS — H539 Unspecified visual disturbance: Secondary | ICD-10-CM | POA: Diagnosis not present

## 2023-11-08 DIAGNOSIS — G932 Benign intracranial hypertension: Secondary | ICD-10-CM

## 2023-11-08 DIAGNOSIS — R519 Headache, unspecified: Secondary | ICD-10-CM

## 2023-11-08 MED ORDER — TOPIRAMATE 25 MG PO TABS: 50.0000 mg | ORAL_TABLET | Freq: Every day | ORAL | 3 refills | Status: AC

## 2023-11-08 NOTE — Patient Instructions (Addendum)
 -Continue B12 1000 mcg daily supplementation -Continue Vitamin D  supplementation -Continue propranolol 10 mg TID as this could help with headaches -Continue magnesium and CoQ10 as these can help with headaches -Increase Topamax  to 50 mg daily (take 2 tablets daily) - a new prescription has been sent to your pharmacy but you can continue to take the medication bottle you already have   Vitamins and herbs that show potential for headaches:  Magnesium: Magnesium (250 mg twice a day or 500 mg at bed) has a relaxant effect on smooth muscles such as blood vessels. Individuals suffering from frequent or daily headache usually have low magnesium levels which can be increase with daily supplementation of 400-750 mg. Three trials found 40-90% average headache reduction when used as a preventative. Magnesium also demonstrated the benefit in menstrually related migraine. Magnesium is part of the messenger system in the serotonin cascade and it is a good muscle relaxant. It is also useful for constipation which can be a side effect of other medications used to treat migraine. Good sources include nuts, whole grains, and tomatoes.  Riboflavin (vitamin B 2) 200 mg twice a day. This vitamin assists nerve cells in the production of ATP a principal energy storing molecule. It is necessary for many chemical reactions in the body. There have been at least 3 clinical trials of riboflavin using 400 mg per day all of which suggested that migraine frequency can be decreased. All 3 trials showed significant improvement in over half of migraine sufferers. The supplement is found in bread, cereal, milk, meat, and poultry. Most Americans get more riboflavin than the recommended daily allowance, however riboflavin deficiency is not necessary for the supplements to help prevent headache.  Feverfew: Feverfew is a common garden herb native to [place] and popular in [place] as a treatment for disorders typically controlled by  aspirin. The mechanism of action is unknown but is believed to be related to a chemical called parthenolide which helps the body use serotonin more effectively. Serotonin helps prevent migraine and assists with resolution when it occurs. Parthenolide also inhibits the release of histamine which is linked to pain and inflammation.  Consistency of active ingredients in different products can be a problem. Some formulations don't have the active ingredient (parthenolide) that prevents migraine. A parthenolide content of 0.2% is generally recommended. Typical dosage is one capsule 3 times a day.  Coenzyme Q10: This is present in almost all cells in the body and is critical component for the conversion of energy. Recent studies have shown that a nutritional supplement of CoQ10 can reduce the frequency of migraine attacks by improving the energy production of cells as with riboflavin. Doses of 150 mg twice a day have been shown to be effective.  Melatonin: Increasing evidence shows correlation between melatonin secretion and headache conditions. Melatonin supplementation has decreased headache intensity and duration. It is widely used as a sleep aid. Sleep is natures way of dealing with migraine. A dose of 3 mg is recommended to start for headaches including cluster headache. Higher doses up to 15 mg has been reviewed for use in Cluster headache and have been used. The rationale behind using melatonin for cluster is that many theories regarding the cause of Cluster headache center around the disruption of the normal circadian rhythm in the brain. This helps restore the normal circadian rhythm.  Ginger: Ginger has a small amount of antihistamine and anti-inflammatory action which may help headache. It is primarily used for nausea and may aid in the  absorption of other medications.  The physicians and staff at [facility] are committed to providing excellent care. You may receive a survey requesting  feedback about your experience at our office. We strive to receive very good responses to the survey questions. If you feel that your experience would prevent you from giving the office a very good  response, please contact our office to try to remedy the situation. We may be reached at [phone]. Thank you for taking the time out of your busy day to complete the survey.  Jeanne Potters, MD [facility]

## 2023-11-18 ENCOUNTER — Other Ambulatory Visit: Payer: Self-pay

## 2023-11-21 ENCOUNTER — Other Ambulatory Visit: Payer: Self-pay | Admitting: Pharmacy Technician

## 2023-11-21 ENCOUNTER — Other Ambulatory Visit: Payer: Self-pay

## 2023-11-21 NOTE — Progress Notes (Signed)
 Specialty Pharmacy Refill Coordination Note  Jeanne Bush is a 48 y.o. female contacted today regarding refills of specialty medication(s) Upadacitinib  (Rinvoq )   Patient requested Delivery   Delivery date: 11/22/23   Verified address: [address]  [place] [address]   Medication will be filled on 11/21/23.

## 2023-11-21 NOTE — Progress Notes (Signed)
 Clinical Intervention Note  Clinical Intervention Notes: Patient reported dose increase of Topiramate  to 50 mg. No drug-drug interactions with Rinvoq  identified.   Clinical Intervention Outcomes: Prevention of an adverse drug event   Powell CHRISTELLA Gallus Specialty Pharmacist

## 2023-11-25 ENCOUNTER — Encounter: Payer: Self-pay | Admitting: Internal Medicine

## 2023-11-27 ENCOUNTER — Other Ambulatory Visit (HOSPITAL_COMMUNITY): Payer: Self-pay

## 2023-12-12 ENCOUNTER — Other Ambulatory Visit: Payer: Self-pay

## 2023-12-23 ENCOUNTER — Other Ambulatory Visit: Payer: Self-pay | Admitting: Internal Medicine

## 2023-12-23 ENCOUNTER — Other Ambulatory Visit: Payer: Self-pay

## 2023-12-23 DIAGNOSIS — M05741 Rheumatoid arthritis with rheumatoid factor of right hand without organ or systems involvement: Secondary | ICD-10-CM

## 2023-12-23 DIAGNOSIS — M351 Other overlap syndromes: Secondary | ICD-10-CM

## 2023-12-24 ENCOUNTER — Other Ambulatory Visit (HOSPITAL_COMMUNITY): Payer: Self-pay

## 2023-12-24 NOTE — Telephone Encounter (Signed)
 Last Fill: 11/30/2022  Next Visit: 01/16/2024  Last Visit: 10/07/2023  Dx: Rheumatoid arthritis involving both hands with positive rheumatoid factor   Current Dose per office note on 10/07/2023: folic acid  1 mg daily   Okay to refill Folic Acid ?

## 2024-01-03 NOTE — Progress Notes (Signed)
 Office Visit Note  Patient: Jeanne Bush             Date of Birth: 05-05-1975           MRN: [id number]             PCP: Dyana Vikki SAILOR, DO Referring: Dyana Vikki SAILOR, DO Visit Date: 01/16/2024   Subjective:  Medical Management of Chronic Issues (Patient is not taking the Rinvoq .)   Discussed the use of AI scribe software for clinical note transcription with the patient, who gave verbal consent to proceed.  History of Present Illness   Jeanne Bush is a 48 y.o. 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 She presents with fatigue and lack of energy.  She has been experiencing significant fatigue, lack of focus, and disinterest in activities for about a month, which began after discontinuing Rinvoq  due to feeling unwell. She continues to take methotrexate  for her rheumatoid arthritis.  Her primary care physician increased her vitamin D  supplementation and started her on an iron supplement due to low levels, but these changes have not alleviated her fatigue. She also experiences frequent urination, dry mouth, and increased thirst. She has not had recent blood sugar or hemoglobin A1c tests.  She experiences joint stiffness in her neck, hands, elbows, and hips, with slight swelling in her hand but no significant swelling elsewhere. Her skin rashes are stable, but she has a persistent boil on her buttocks and a bug bite that has not resolved. She ran out of prednisone  two to three days ago, which she had been taking daily.  She experiences occasional visual disturbances described as a 'seventies effect' and slight flashes, which have decreased in frequency. She is currently taking Topamax  50 mg at night for migraines. She reports frequent headaches, particularly upon waking.  She has been experiencing unintentional weight loss, down about  10lbs compared to our last recorded clinic values. She uses a non-scented lotion for her dry skin, which is particularly dry around her mouth.       Previous HPI 10/07/2023 Jeanne Bush is a 48 y.o. female here for follow up with mixed connective tissue disease/RA overlap on methotrexate  20 mg subcu weekly, folic acid  1 mg daily, and rinvoq  15 mg daily.  We switched treatment from adalimumab  to Rinvoq  after last visit due to concern about worsening headaches possibly intracranial hypertension related symptoms with associated visual change.   Joint pain is described as 'really achy lately,' particularly affecting the hips, shoulders, and muscles across the chest. Morning stiffness lasts about an hour. They are uncertain if the change in medication has affected their symptoms.   Their skin condition is improving, with significant drainage in the gluteal area two weeks ago now healed. No new areas of concern in axillae have developed since they stopped shaving.   They experience soreness in their back muscles, extending down the sides of their legs, and a tingling sensation that occasionally travels up their neck. Neck exercises provide relief. They also note that their foot tends to kick out into external rotation when walking, which they try to correct.   They experience headaches characterized by a sensation of pressure, akin to someone pushing on their head. The strobing effect, described as a 'seventies disco effect,' has resolved, but they continue to have a fluttering sensation at the edges of their vision.  A new symptom is a liquid sensation in their head.     Previous HPI 07/08/2023 Jeanne Bush is a 48 y.o. female here for follow up with mixed connective tissue disease/RA overlap on methotrexate  20 mg subcu weekly, folic acid  1 mg daily, and adalimumab  40 mg subcu q. 14 days.     She has been experiencing worsening visual symptoms since starting Humira , describing a sensation of pressure  in her head, particularly around the optic nerves. She has not yet seen a neurologist for these symptoms but has an appointment scheduled soon. A lumbar puncture performed a few years ago showed elevated intracranial pressure, though not significantly high.   Persistent skin rashes have not improved with Humira . The rashes are described as scabbed over and draining. Additionally, there has been no significant improvement in joint pain or stiffness since starting the medication.   Her past medical history includes a previous episode of shingles following chickenpox in childhood and eczema, particularly on her hands and legs. Her husband has a history of severe diverticulitis, but she does not.   No improvement in skin rashes, joint pain, or stiffness with Humira . No history of severe diverticulitis. No cardiovascular disease or history of blood clots. No history of strokes or heart attacks.         Has lipid panel 05/15/23   Previous HPI 04/09/2023 Jeanne Bush is a 48 y.o. female here for follow up with mixed connective tissue disease/RA overlap on methotrexate  20 mg subcu weekly, folic acid  1 mg daily, and adalimumab  40 mg subcu q. 14 days. She has been on Humira  for approximately two months now. They report no significant improvement in joint pain or skin rashes, but note that the lesion on the buttocks appears to be scabbing over.   The patient also reports a persistent cluster headache, which began shortly after the initiation of Humira . The headache is characterized by a sensation of pressure at the back of the eyes and a kaleidoscope effect on the periphery of their vision. This has been ongoing for about three weeks, which is significantly longer than their usual headache duration of one to two days. The patient denies any associated pain, palpitations, or sinus symptoms.   The patient is also on prednisone , methotrexate , and a muscle relaxer. They have noticed some weight gain since their  last visit, which they attribute to the prednisone . They are also in the process of switching insurance due to approval of disability, and plan to see an optometrist or ophthalmologist once this is finalized.   The patient denies any major flare-ups or swollen areas since the last visit. They report that joint pain has either been normal or slightly improved, but muscle pain persists. They also mention a persistent rash in the groin area.      Previous HPI 01/31/2023 Jeanne Bush is a 48 y.o. female here for follow up with mixed connective tissue disease/RA overlap on methotrexate  20 mg subcu weekly and folic acid  1 mg daily.  Since our last visit joint pain and stiffness is doing pretty well but remains on 5 mg prednisone  daily.  She was able to see dermatology with [facility] for skin biopsy that confirmed hidradenitis.  Stillness we discussed previous recommendation for biologic DMARD treatment here to discuss medication and needs baseline laboratory testing.   Previous HPI 11/30/2022 Jeanne Bush is a 48 y.o. female here for follow up with mixed connective tissue disease/RA overlap on methotrexate  20 mg subcu weekly and folic acid  1 mg  daily.  After finishing the previous prednisone  course developed a significant increase in joint pain and stiffness and muscular pain throughout upper and lower extremities.  Especially sore in her fingers and wrists.  Resuming prednisone  at 5 mg daily helped a lot with these musculoskeletal symptoms.  Still having issues with chronic fatigue and brain fog type concentration difficulty.  She is having some more indigestion due to stopping her omeprazole at recommendations of the pharmacist with starting methotrexate .  Also found to have a rash on her buttocks and gluteal region is recommended to see dermatology for suspicion of possible hidradenitis suppurativa.  No previous history of similar chronic skin lesions no involvement in the axilla.   Previous  HPI 08/29/2022 Jeanne Bush is a 48 y.o. female here for follow up with mixed connective tissue disease/RA overlap after resuming methotrexate  20 mg subcu weekly and folic acid  1 mg daily since our visit in March.  She did not notice any appreciable side effect while resuming methotrexate  but is also still having a good amount of symptoms with pain and stiffness and fatigue.  For about the past 1 week he has significant increase in pain especially throughout her chest and back.  This is most noticeable when trying to roll over in bed or with any direct pressure on these areas.  Has not experienced any new peripheral joint swelling.  She is also having more trouble with acid reflux after discontinuing omeprazole at her pharmacist recommendation since starting methotrexate .  This drug was originally recommended based on her ENT with what sounds like laryngal pharyngeal reflux symptoms. Does not also complaining of feeling excessively dry all the time despite drinking a lot of water daily and frequent urination.  Not specifically dryness of the eyes or mouth although she does get benefit using the eyedrops.   Previous HPI 06/25/22 Jeanne Bush is a 48 y.o. 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 Sherrill at [facility]. She was on subcutaneous methotrexate  treatment and cymbalta, lyrica, and voltaren for symptoms due to arthritis and the fibromyalgia. Labs positive for ANA, RNP, and RF. She has had some shortness of breath and pulmonary studies indicative for small airways disease with no pulmonary fibrosis on previous CT chest exam. History of eczema treated with cellcept and methotrexate , had previous biopsy for this in 2014. Hydroxychloroquine was stopped due to increasing dizziness symptoms.  She has now been off methotrexate  due to lack of clinical follow-up after she  lost the insurance.  Off the medication has experienced some increase in joint pain and stiffness as well as eczematous rashes. She experiences some swelling as well as pain and stiffness in both hands.  Had carpal tunnel release surgery last year without any great relief of symptoms and is still getting problems with the hand and wrist pains and sometimes numbness.  Does not see much swelling in her other joints.  She pretty often experiences pain across the middle and upper chest region this had been suspected as pleurisy by some previous providers no particular diagnostic testing or findings associated with this. She has longstanding history of Raynaud's symptoms not associated with any ulcers or digital pitting. Eczema skin rashes on the chest are new since.  Previously had axillary skin lesions questionable for hidradenitis suppurativa but these pretty much resolved with stopping shaving. She originally had onset of chronic joint pain and sensitivity since she was a teenager.  The symptoms were attributed as growing pains but the generalized pain and sensitivity she has now remained similar to what she experienced at that time.  She gets sensitivity to touch pretty much all over on a daily basis.  Also with persistent brain fog and fatigue.  She has chronic mild constipation symptoms but without any serious GI complications.       Labs reviewed 2019 SSA pos U1RNP >150   07/2016 ANA pos RNP pos RF 36 HCV neg HBV neg   02/25/19 HRCT Chest w/o Contrast No pulmonary fibrosis   Review of Systems  Constitutional:  Positive for fatigue.  HENT:  Positive for mouth dryness. Negative for mouth sores.   Eyes:  Positive for dryness.  Respiratory:  Positive for shortness of breath.   Cardiovascular:  Positive for chest pain and palpitations.  Gastrointestinal:  Positive for constipation. Negative for blood in stool and diarrhea.  Endocrine: Negative for increased urination.  Genitourinary:   Negative for involuntary urination.  Musculoskeletal:  Positive for joint pain, joint pain, joint swelling, myalgias, muscle weakness, morning stiffness, muscle tenderness and myalgias. Negative for gait problem.  Skin:  Negative for color change, rash, hair loss and sensitivity to sunlight.  Allergic/Immunologic: Negative for susceptible to infections.  Neurological:  Positive for dizziness and headaches.  Hematological:  Negative for swollen glands.  Psychiatric/Behavioral:  Negative for depressed mood and sleep disturbance. The patient is not nervous/anxious.     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   Mixed connective tissue disease 03/08/2022   Raynaud's disease without gangrene 03/08/2022   Myopia of both eyes with astigmatism 01/15/2017   Plaquenil causing adverse effect in therapeutic use 01/15/2017   Rheumatoid arthritis involving both hands with positive rheumatoid factor (HCC) 10/12/2016   Allergic rhinitis 08/10/2013   Gastro-esophageal reflux disease without esophagitis 06/15/2013   Hypothyroidism 06/15/2013   Atopic dermatitis 06/15/2013   Obesity 06/15/2013   Plantar fascial fibromatosis 06/15/2013   Pure hyperglyceridemia 06/15/2013   Palpitations 01/07/2013   Sinus tachycardia 01/06/2013   Low back pain 11/18/2012    Past Medical History:  Diagnosis Date   Hypertension    Hyperthyroidism    Mixed connective tissue disease    Rheumatoid arthritis (HCC)    Tachycardia     Family History  Problem Relation Age of Onset   Thyroid  disease Mother    Arthritis Father    Atrial fibrillation Father    Congestive Heart Failure Father    Breast cancer Maternal Aunt    Past Surgical  History:  Procedure Laterality Date   BILATERAL CARPAL TUNNEL RELEASE Bilateral    CESAREAN SECTION     CESAREAN SECTION     TUBAL LIGATION     Social History   Social History Narrative   Are you right handed or left handed? Right   Are you currently employed ? 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 no   Who lives with you? family   What type of home do you live in: 1 story or 2 story? three   Caffiene one cup in AM    Immunization History  Administered Date(s) Administered   Influenza,inj,Quad PF,6+ Mos  01/07/2013, 01/31/2017, 01/15/2019, 04/07/2020, 03/08/2022   Influenza-Unspecified 02/13/2017   Pneumococcal Conjugate-13 06/11/2017   Tdap 01/18/2005, 08/10/2015     Objective: Vital Signs: BP 108/73   Pulse 79   Temp 97.7 F (36.5 C)   Resp 16   Ht 5' 1 (1.549 m)   Wt 192 lb (87.1 kg)   LMP 01/09/2024   BMI 36.28 kg/m    Physical Exam Constitutional:      Appearance: She is obese.  Eyes:     Conjunctiva/sclera: Conjunctivae normal.  Cardiovascular:     Rate and Rhythm: Normal rate and regular rhythm.  Pulmonary:     Effort: Pulmonary effort is normal.     Breath sounds: Normal breath sounds.  Skin:    General: Skin is warm and dry.  Neurological:     Mental Status: She is alert.  Psychiatric:        Mood and Affect: Mood normal.      Musculoskeletal Exam:  Shoulders full ROM no tenderness or swelling Elbows full ROM no tenderness or swelling Wrists full ROM no tenderness or swelling Fingers full ROM, left MCP joint tenderness to pressure, no synovitis Knees full ROM no tenderness or swelling  Investigation: No additional findings.  Imaging: No results found.  Recent Labs: Lab Results  Component Value Date   WBC 3.9 01/16/2024   HGB 12.3 01/16/2024   PLT 341 01/16/2024   NA 136 01/16/2024   K 4.4 01/16/2024   CL 106 01/16/2024   CO2 20 01/16/2024   GLUCOSE 96 01/16/2024   BUN 8 01/16/2024    CREATININE 0.62 01/16/2024   BILITOT 0.5 01/16/2024   ALKPHOS 25 (L) 03/12/2023   AST 19 01/16/2024   ALT 16 01/16/2024   PROT 7.5 01/16/2024   ALBUMIN 3.5 03/12/2023   CALCIUM 8.9 01/16/2024   GFRAA >60 08/18/2016   QFTBGOLDPLUS NEGATIVE 01/31/2023    Speciality Comments: Humira  started 02/25/23  Procedures:  No procedures performed Allergies: Hydroxychloroquine   Assessment / Plan:     Visit Diagnoses: Rheumatoid arthritis involving both hands with positive rheumatoid factor (HCC) - Plan: methotrexate  50 MG/2ML injection, predniSONE  (DELTASONE ) 5 MG tablet, Sedimentation rate Chronic rheumatoid arthritis affecting right hand with myalgia. Experiencing stiffness in neck, hands, elbow, and hips. No significant swelling. Not on Rinvoq  due to adverse effects. On methotrexate , recently ran out of prednisone . Muscle achiness in chest and arms. - Checking sed rate for disease activity monitoring - Continue methotrexate  20 mg subcu weekly folic acid  1 mg daily - Discontinue rinvoq  for side effects and no large change in symptoms during trial - Prednisone  5 mg daily, if inflammatory labs also improve could try taper or discontinue otherwise maintain at current dose  High risk medication use - switch to rinvoq  15 mg daily, methotrexate  20 mg subcu weekly - Plan: CBC with Differential/Platelet, Comprehensive metabolic panel with GFR Appears to be tolerating medications well.  No serious interval infections.  Headache related problems unclear if affected before vs after rinvoq . - Checking CBC and CMP for medication monitoring continue long-term use of methotrexate   Myofascial pain - On Lyrica and Cymbalta for this already - Plan: cyclobenzaprine  (FLEXERIL ) 5 MG tablet  Hyperglycemia - Plan: Hemoglobin A1c Fatigue and low energy, evaluation for possible metabolic or endocrine etiology Persistent fatigue and low energy. Recent increase in vitamin D  and iron supplementation. Concerns about  possible hyperglycemia due to symptoms of frequent urination, dry mouth, and thirst. No recent hemoglobin A1c test documented. Weight loss noted  but not concerning. - Order hemoglobin A1c. - Recheck blood count and metabolic panel.   Orders: Orders Placed This Encounter  Procedures   Hemoglobin A1c   CBC with Differential/Platelet   Comprehensive metabolic panel with GFR   Sedimentation rate   Meds ordered this encounter  Medications   cyclobenzaprine  (FLEXERIL ) 5 MG tablet    Sig: Take 1 tablet (5 mg total) by mouth at bedtime as needed for muscle spasms.    Dispense:  90 tablet    Refill:  0   methotrexate  50 MG/2ML injection    Sig: Inject 0.8 mLs (20 mg total) into the skin once a week.    Dispense:  10 mL    Refill:  0   predniSONE  (DELTASONE ) 5 MG tablet    Sig: Take 1 tablet (5 mg total) by mouth daily with breakfast.    Dispense:  90 tablet    Refill:  0   TUBERCULIN SYR 1CC/27GX1/2 (B-D TB SYRINGE 1CC/27GX1/2) 27G X 1/2 1 ML MISC    Sig: USE TO INJECT METHOTREXATE  UNDER THE SKIN ONCE WEEKLY    Dispense:  25 each    Refill:  0     Follow-Up Instructions: Return in about 3 months (around 04/17/2024) for RA/MCTD/HS on MTX/GC f/u 3mos.   Lonni LELON Ester, MD  Note - This record has been created using [employer].  Chart creation errors have been sought, but may not always  have been located. Such creation errors do not reflect on  the standard of medical care.

## 2024-01-07 ENCOUNTER — Other Ambulatory Visit: Payer: Self-pay

## 2024-01-15 ENCOUNTER — Other Ambulatory Visit: Payer: Self-pay | Admitting: Family Medicine

## 2024-01-15 DIAGNOSIS — Z1231 Encounter for screening mammogram for malignant neoplasm of breast: Secondary | ICD-10-CM

## 2024-01-16 ENCOUNTER — Encounter: Payer: Self-pay | Admitting: Internal Medicine

## 2024-01-16 ENCOUNTER — Ambulatory Visit: Attending: Internal Medicine | Admitting: Internal Medicine

## 2024-01-16 ENCOUNTER — Other Ambulatory Visit: Payer: Self-pay

## 2024-01-16 VITALS — BP 108/73 | HR 79 | Temp 97.7°F | Resp 16 | Ht 61.0 in | Wt 192.0 lb

## 2024-01-16 DIAGNOSIS — R739 Hyperglycemia, unspecified: Secondary | ICD-10-CM

## 2024-01-16 DIAGNOSIS — M05742 Rheumatoid arthritis with rheumatoid factor of left hand without organ or systems involvement: Secondary | ICD-10-CM

## 2024-01-16 DIAGNOSIS — M7918 Myalgia, other site: Secondary | ICD-10-CM | POA: Diagnosis not present

## 2024-01-16 DIAGNOSIS — Z79899 Other long term (current) drug therapy: Secondary | ICD-10-CM

## 2024-01-16 DIAGNOSIS — M05741 Rheumatoid arthritis with rheumatoid factor of right hand without organ or systems involvement: Secondary | ICD-10-CM

## 2024-01-16 DIAGNOSIS — M351 Other overlap syndromes: Secondary | ICD-10-CM

## 2024-01-16 MED ORDER — BD TB SYRINGE 27G X 1/2" 1 ML MISC: 0 refills | Status: AC

## 2024-01-16 MED ORDER — METHOTREXATE SODIUM CHEMO INJECTION 50 MG/2ML: 20.0000 mg | INTRAMUSCULAR | 0 refills | Status: DC

## 2024-01-16 MED ORDER — PREDNISONE 5 MG PO TABS: 5.0000 mg | ORAL_TABLET | Freq: Every day | ORAL | 0 refills | Status: DC

## 2024-01-16 MED ORDER — CYCLOBENZAPRINE HCL 5 MG PO TABS: 5.0000 mg | ORAL_TABLET | Freq: Every evening | ORAL | 0 refills | Status: DC | PRN

## 2024-01-17 LAB — CBC WITH DIFFERENTIAL/PLATELET
Absolute Lymphocytes: 920 {cells}/uL (ref 850–3900)
Absolute Monocytes: 566 {cells}/uL (ref 200–950)
Basophils Absolute: 12 {cells}/uL (ref 0–200)
Basophils Relative: 0.3 %
Eosinophils Absolute: 59 {cells}/uL (ref 15–500)
Eosinophils Relative: 1.5 %
HCT: 38.1 % (ref 35.0–45.0)
Hemoglobin: 12.3 g/dL (ref 11.7–15.5)
MCH: 30.8 pg (ref 27.0–33.0)
MCHC: 32.3 g/dL (ref 32.0–36.0)
MCV: 95.5 fL (ref 80.0–100.0)
MPV: 10.3 fL (ref 7.5–12.5)
Monocytes Relative: 14.5 %
Neutro Abs: 2344 {cells}/uL (ref 1500–7800)
Neutrophils Relative %: 60.1 %
Platelets: 341 Thousand/uL (ref 140–400)
RBC: 3.99 Million/uL (ref 3.80–5.10)
RDW: 12.7 % (ref 11.0–15.0)
Total Lymphocyte: 23.6 %
WBC: 3.9 Thousand/uL (ref 3.8–10.8)

## 2024-01-17 LAB — COMPREHENSIVE METABOLIC PANEL WITH GFR
AG Ratio: 1.1 (calc) (ref 1.0–2.5)
ALT: 16 U/L (ref 6–29)
AST: 19 U/L (ref 10–35)
Albumin: 4 g/dL (ref 3.6–5.1)
Alkaline phosphatase (APISO): 23 U/L — ABNORMAL LOW (ref 31–125)
BUN: 8 mg/dL (ref 7–25)
CO2: 20 mmol/L (ref 20–32)
Calcium: 8.9 mg/dL (ref 8.6–10.2)
Chloride: 106 mmol/L (ref 98–110)
Creat: 0.62 mg/dL (ref 0.50–0.99)
Globulin: 3.5 g/dL (ref 1.9–3.7)
Glucose, Bld: 96 mg/dL (ref 65–99)
Potassium: 4.4 mmol/L (ref 3.5–5.3)
Sodium: 136 mmol/L (ref 135–146)
Total Bilirubin: 0.5 mg/dL (ref 0.2–1.2)
Total Protein: 7.5 g/dL (ref 6.1–8.1)
eGFR: 110 mL/min/1.73m2 (ref >=60)

## 2024-01-17 LAB — HEMOGLOBIN A1C
Hgb A1c MFr Bld: 5.2 % (ref <5.7)
Mean Plasma Glucose: 103 mg/dL
eAG (mmol/L): 5.7 mmol/L

## 2024-01-17 LAB — SEDIMENTATION RATE: Sed Rate: 31 mm/h — ABNORMAL HIGH (ref 0–20)

## 2024-01-20 ENCOUNTER — Other Ambulatory Visit (HOSPITAL_COMMUNITY): Payer: Self-pay

## 2024-01-21 ENCOUNTER — Telehealth: Payer: Self-pay | Admitting: Pharmacist

## 2024-01-21 ENCOUNTER — Other Ambulatory Visit (HOSPITAL_COMMUNITY): Payer: Self-pay

## 2024-01-21 NOTE — Telephone Encounter (Signed)
 Received messages from pharmacy about Rinvoq  status. Patient had discontinued before OV and at OV, her and Dr. Jeannetta planned to remain off of Rinvoq . She continue weekly subcut MTX  Disenrolled from [facility], PharmD, MPH, BCPS, CPP Clinical Pharmacist [facility])

## 2024-02-12 ENCOUNTER — Ambulatory Visit
Admission: RE | Admit: 2024-02-12 | Discharge: 2024-02-12 | Disposition: A | Discharge status: Home / Self Care | Source: Ambulatory Visit | Attending: Family Medicine | Admitting: Family Medicine

## 2024-02-12 DIAGNOSIS — Z1231 Encounter for screening mammogram for malignant neoplasm of breast: Secondary | ICD-10-CM | POA: Diagnosis present

## 2024-02-18 ENCOUNTER — Other Ambulatory Visit: Payer: Self-pay | Admitting: *Deleted

## 2024-02-18 ENCOUNTER — Encounter: Payer: Self-pay | Admitting: Family Medicine

## 2024-02-18 ENCOUNTER — Inpatient Hospital Stay
Admission: RE | Admit: 2024-02-18 | Discharge: 2024-02-18 | Disposition: A | Discharge status: Home / Self Care | Payer: Self-pay | Source: Ambulatory Visit | Attending: Family Medicine | Admitting: Family Medicine

## 2024-02-18 DIAGNOSIS — Z1231 Encounter for screening mammogram for malignant neoplasm of breast: Secondary | ICD-10-CM

## 2024-02-25 ENCOUNTER — Other Ambulatory Visit: Payer: Self-pay | Admitting: Family Medicine

## 2024-02-25 DIAGNOSIS — R928 Other abnormal and inconclusive findings on diagnostic imaging of breast: Secondary | ICD-10-CM

## 2024-02-26 ENCOUNTER — Ambulatory Visit
Admission: RE | Admit: 2024-02-26 | Discharge: 2024-02-26 | Disposition: A | Discharge status: Home / Self Care | Source: Ambulatory Visit | Attending: Family Medicine | Admitting: Family Medicine

## 2024-02-26 DIAGNOSIS — R928 Other abnormal and inconclusive findings on diagnostic imaging of breast: Secondary | ICD-10-CM | POA: Diagnosis present

## 2024-03-12 ENCOUNTER — Other Ambulatory Visit (HOSPITAL_COMMUNITY): Payer: Self-pay

## 2024-04-06 NOTE — Progress Notes (Signed)
 "  Office Visit Note  Patient: Jeanne Bush             Date of Birth: [birth year]           MRN: [id number]             PCP: Dyana Vikki SAILOR, DO Referring: Dyana Vikki SAILOR, DO Visit Date: 04/17/2024   Subjective:  Pain of the Right Hip and Pain of the Right Hand   Discussed the use of AI scribe software for clinical note transcription with the patient, who gave verbal consent to proceed.  History of Present Illness   Jeanne Bush is a 49 y.o. 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   She has been experiencing a flare-up of hip pain for the past two to three weeks. The pain is primarily located in the groin area, wraps around the hip, and radiates down towards the knee. It is severe enough to significantly limit her activities, describing it as 'putting her down' and causing her to be 'not doing much at all.'  She is uncertain whether the pain originates from the joint or the muscle, but describes it as a tightness, particularly in the groin area. She has been using a portable heating pad, a jacuzzi, and applying a cream for relief, but these measures have not been very effective.  Her current medication regimen includes methotrexate , prednisone , and Flexeril . She takes prednisone  in the morning and Flexeril  at night. She has not had any recent falls or unusual activities that could have triggered the pain. She has not received any cortisone shots in the hip, only in her wrists previously, which provided minimal relief.  In addition to the hip pain, she reports aching in her hands and difficulty bending them, as well as chronic neck pain.      Previous HPI 01/16/2024 Jeanne Bush is a 49 y.o. 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 She presents with fatigue and lack of energy.   She has been experiencing significant fatigue, lack of focus, and disinterest in activities for about a month, which began after discontinuing Rinvoq  due to feeling unwell. She continues to take methotrexate  for her rheumatoid arthritis.   Her primary care physician increased her vitamin D  supplementation and started her on an iron supplement due to low levels, but these changes have not alleviated her fatigue. She also experiences frequent urination, dry mouth, and increased thirst. She has not had recent blood sugar or hemoglobin A1c tests.   She experiences joint stiffness in her neck, hands, elbows, and hips, with slight swelling in her hand but no significant swelling elsewhere. Her skin rashes are stable, but she has a persistent boil on her buttocks and a bug bite that has not resolved. She ran out of prednisone  two to three days ago, which she had been taking daily.   She experiences occasional visual disturbances described as a 'seventies effect' and slight flashes, which have decreased in frequency. She is currently taking Topamax  50 mg at night for migraines. She reports frequent headaches, particularly upon waking.   She has been experiencing unintentional weight loss, down about 10lbs compared to our last recorded clinic values. She uses a non-scented lotion for her  dry skin, which is particularly dry around her mouth.         Previous HPI 10/07/2023 Jeanne Bush is a 49 y.o. female here for follow up with mixed connective tissue disease/RA overlap on methotrexate  20 mg subcu weekly, folic acid  1 mg daily, and rinvoq  15 mg daily.  We switched treatment from adalimumab  to Rinvoq  after last visit due to concern about worsening headaches possibly intracranial hypertension related symptoms with associated visual change.   Joint pain is described as 'really achy lately,' particularly  affecting the hips, shoulders, and muscles across the chest. Morning stiffness lasts about an hour. They are uncertain if the change in medication has affected their symptoms.   Their skin condition is improving, with significant drainage in the gluteal area two weeks ago now healed. No new areas of concern in axillae have developed since they stopped shaving.   They experience soreness in their back muscles, extending down the sides of their legs, and a tingling sensation that occasionally travels up their neck. Neck exercises provide relief. They also note that their foot tends to kick out into external rotation when walking, which they try to correct.   They experience headaches characterized by a sensation of pressure, akin to someone pushing on their head. The strobing effect, described as a 'seventies disco effect,' has resolved, but they continue to have a fluttering sensation at the edges of their vision. A new symptom is a liquid sensation in their head.     Previous HPI 07/08/2023 Jeanne Bush is a 49 y.o. female here for follow up with mixed connective tissue disease/RA overlap on methotrexate  20 mg subcu weekly, folic acid  1 mg daily, and adalimumab  40 mg subcu q. 14 days.     She has been experiencing worsening visual symptoms since starting Humira , describing a sensation of pressure in her head, particularly around the optic nerves. She has not yet seen a neurologist for these symptoms but has an appointment scheduled soon. A lumbar puncture performed a few years ago showed elevated intracranial pressure, though not significantly high.   Persistent skin rashes have not improved with Humira . The rashes are described as scabbed over and draining. Additionally, there has been no significant improvement in joint pain or stiffness since starting the medication.   Her past medical history includes a previous episode of shingles following chickenpox in childhood and eczema, particularly on  her hands and legs. Her husband has a history of severe diverticulitis, but she does not.   No improvement in skin rashes, joint pain, or stiffness with Humira . No history of severe diverticulitis. No cardiovascular disease or history of blood clots. No history of strokes or heart attacks.         Has lipid panel 05/15/23   Previous HPI 04/09/2023 Jeanne Bush is a 49 y.o. female here for follow up with mixed connective tissue disease/RA overlap on methotrexate  20 mg subcu weekly, folic acid  1 mg daily, and adalimumab  40 mg subcu q. 14 days. She has been on Humira  for approximately two months now. They report no significant improvement in joint pain or skin rashes, but note that the lesion on the buttocks appears to be scabbing over.   The patient also reports a persistent cluster headache, which began shortly after the initiation of Humira . The headache is characterized by a sensation of pressure at the back of the eyes and a kaleidoscope effect on the periphery of their vision. This has been ongoing for about three weeks, which is  significantly longer than their usual headache duration of one to two days. The patient denies any associated pain, palpitations, or sinus symptoms.   The patient is also on prednisone , methotrexate , and a muscle relaxer. They have noticed some weight gain since their last visit, which they attribute to the prednisone . They are also in the process of switching insurance due to approval of disability, and plan to see an optometrist or ophthalmologist once this is finalized.   The patient denies any major flare-ups or swollen areas since the last visit. They report that joint pain has either been normal or slightly improved, but muscle pain persists. They also mention a persistent rash in the groin area.      Previous HPI 01/31/2023 Jeanne Bush is a 49 y.o. female here for follow up with mixed connective tissue disease/RA overlap on methotrexate  20 mg subcu weekly  and folic acid  1 mg daily.  Since our last visit joint pain and stiffness is doing pretty well but remains on 5 mg prednisone  daily.  She was able to see dermatology with [facility] for skin biopsy that confirmed hidradenitis.  Stillness we discussed previous recommendation for biologic DMARD treatment here to discuss medication and needs baseline laboratory testing.   Previous HPI 11/30/2022 Jeanne Bush is a 49 y.o. female here for follow up with mixed connective tissue disease/RA overlap on methotrexate  20 mg subcu weekly and folic acid  1 mg daily.  After finishing the previous prednisone  course developed a significant increase in joint pain and stiffness and muscular pain throughout upper and lower extremities.  Especially sore in her fingers and wrists.  Resuming prednisone  at 5 mg daily helped a lot with these musculoskeletal symptoms.  Still having issues with chronic fatigue and brain fog type concentration difficulty.  She is having some more indigestion due to stopping her omeprazole at recommendations of the pharmacist with starting methotrexate .  Also found to have a rash on her buttocks and gluteal region is recommended to see dermatology for suspicion of possible hidradenitis suppurativa.  No previous history of similar chronic skin lesions no involvement in the axilla.   Previous HPI 08/29/2022 Jeanne Bush is a 49 y.o. female here for follow up with mixed connective tissue disease/RA overlap after resuming methotrexate  20 mg subcu weekly and folic acid  1 mg daily since our visit in March.  She did not notice any appreciable side effect while resuming methotrexate  but is also still having a good amount of symptoms with pain and stiffness and fatigue.  For about the past 1 week he has significant increase in pain especially throughout her chest and back.  This is most noticeable when trying to roll over in bed or with any direct pressure on these areas.  Has not experienced any new peripheral joint  swelling.  She is also having more trouble with acid reflux after discontinuing omeprazole at her pharmacist recommendation since starting methotrexate .  This drug was originally recommended based on her ENT with what sounds like laryngal pharyngeal reflux symptoms. Does not also complaining of feeling excessively dry all the time despite drinking a lot of water daily and frequent urination.  Not specifically dryness of the eyes or mouth although she does get benefit using the eyedrops.   Previous HPI 06/25/22 Jeanne Bush is a 49 y.o. 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 Sherrill at [facility]. She was on subcutaneous methotrexate  treatment and  cymbalta, lyrica, and voltaren for symptoms due to arthritis and the fibromyalgia. Labs positive for ANA, RNP, and RF. She has had some shortness of breath and pulmonary studies indicative for small airways disease with no pulmonary fibrosis on previous CT chest exam. History of eczema treated with cellcept and methotrexate , had previous biopsy for this in 2014. Hydroxychloroquine was stopped due to increasing dizziness symptoms.  She has now been off methotrexate  due to lack of clinical follow-up after she lost the insurance.  Off the medication has experienced some increase in joint pain and stiffness as well as eczematous rashes. She experiences some swelling as well as pain and stiffness in both hands.  Had carpal tunnel release surgery last year without any great relief of symptoms and is still getting problems with the hand and wrist pains and sometimes numbness.  Does not see much swelling in her other joints.  She pretty often experiences pain across the middle and upper chest region this had been suspected as pleurisy by some previous providers no particular diagnostic testing or findings associated with this. She has  longstanding history of Raynaud's symptoms not associated with any ulcers or digital pitting. Eczema skin rashes on the chest are new since.  Previously had axillary skin lesions questionable for hidradenitis suppurativa but these pretty much resolved with stopping shaving. She originally had onset of chronic joint pain and sensitivity since she was a teenager.  The symptoms were attributed as growing pains but the generalized pain and sensitivity she has now remained similar to what she experienced at that time.  She gets sensitivity to touch pretty much all over on a daily basis.  Also with persistent brain fog and fatigue.  She has chronic mild constipation symptoms but without any serious GI complications.       Labs reviewed 2019 SSA pos U1RNP >150   07/2016 ANA pos RNP pos RF 36 HCV neg HBV neg   02/25/19 HRCT Chest w/o Contrast No pulmonary fibrosis   Review of Systems  Constitutional:  Positive for fatigue.  HENT:  Positive for mouth dryness. Negative for mouth sores.   Eyes:  Positive for dryness.  Respiratory:  Positive for shortness of breath.   Cardiovascular:  Positive for chest pain. Negative for palpitations.  Gastrointestinal:  Positive for constipation. Negative for blood in stool and diarrhea.  Endocrine: Negative for increased urination.  Genitourinary:  Negative for involuntary urination.  Musculoskeletal:  Positive for joint pain, joint pain, joint swelling, myalgias, muscle weakness, morning stiffness, muscle tenderness and myalgias. Negative for gait problem.  Skin:  Positive for sensitivity to sunlight. Negative for color change, rash and hair loss.  Allergic/Immunologic: Positive for susceptible to infections.  Neurological:  Positive for headaches. Negative for dizziness.  Hematological:  Negative for swollen glands.  Psychiatric/Behavioral:  Positive for sleep disturbance. Negative for depressed mood. The patient is not nervous/anxious.     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   Mixed connective tissue disease 03/08/2022   Raynaud's disease without gangrene 03/08/2022   Myopia of both eyes with astigmatism 01/15/2017   Plaquenil causing adverse effect in therapeutic use 01/15/2017   Rheumatoid arthritis involving both hands with positive rheumatoid factor (HCC) 10/12/2016   Allergic  rhinitis 08/10/2013   Gastro-esophageal reflux disease without esophagitis 06/15/2013   Hypothyroidism 06/15/2013   Atopic dermatitis 06/15/2013   Obesity 06/15/2013   Plantar fascial fibromatosis 06/15/2013   Pure hyperglyceridemia 06/15/2013   Palpitations 01/07/2013   Sinus tachycardia 01/06/2013   Low back pain 11/18/2012    Past Medical History:  Diagnosis Date   Hypertension    Hyperthyroidism    Mixed connective tissue disease    Rheumatoid arthritis (HCC)    Tachycardia     Family History  Problem Relation Age of Onset   Thyroid  disease Mother    Arthritis Father    Atrial fibrillation Father    Congestive Heart Failure Father    Breast cancer Maternal Aunt    Past Surgical History:  Procedure Laterality Date   BILATERAL CARPAL TUNNEL RELEASE Bilateral    CESAREAN SECTION     CESAREAN SECTION     TUBAL LIGATION     Social History   Social History Narrative   Are you right handed or left handed? Right   Are you currently employed ? 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 no   Who lives with you? family   What type of home do you live in: 1 story or 2 story? three   Caffiene one cup in AM    Immunization History  Administered Date(s) Administered   Influenza,inj,Quad PF,6+ Mos 01/07/2013,  01/31/2017, 01/15/2019, 04/07/2020, 03/08/2022   Influenza-Unspecified 02/13/2017   Pneumococcal Conjugate-13 06/11/2017   Tdap 01/18/2005, 08/10/2015     Objective: Vital Signs: BP 111/69   Pulse 84   Temp (!) 97.5 F (36.4 C)   Resp 14   Ht 5' 1 (1.549 m)   Wt 195 lb (88.5 kg)   LMP 03/30/2024   BMI 36.84 kg/m    Physical Exam Eyes:     Conjunctiva/sclera: Conjunctivae normal.  Cardiovascular:     Rate and Rhythm: Normal rate and regular rhythm.  Pulmonary:     Effort: Pulmonary effort is normal.     Breath sounds: Normal breath sounds.  Musculoskeletal:     Right lower leg: No edema.     Left lower leg: No edema.  Lymphadenopathy:     Cervical: No cervical adenopathy.  Skin:    General: Skin is warm and dry.     Findings: No rash.  Neurological:     Mental Status: She is alert.  Psychiatric:        Mood and Affect: Mood normal.      Musculoskeletal Exam:  Shoulders full ROM no tenderness or swelling Elbows full ROM no tenderness or swelling Wrists full ROM no tenderness or swelling Fingers full ROM, mild MCP tenderness no swelling Right lateral hip tenderness to pressure, pain with internal rotation, mild tenderness to pressure anteriorly over hip extending partly towards groin Knees full ROM no tenderness or swelling  Investigation: No additional findings.  Imaging: No results found.  Recent Labs: Lab Results  Component Value Date   WBC 3.9 01/16/2024   HGB 12.3 01/16/2024   PLT 341 01/16/2024   NA 136 01/16/2024   K 4.4 01/16/2024   CL 106 01/16/2024   CO2 20 01/16/2024   GLUCOSE 96 01/16/2024   BUN 8 01/16/2024   CREATININE 0.62 01/16/2024   BILITOT 0.5 01/16/2024   ALKPHOS 25 (L) 03/12/2023   AST 19 01/16/2024   ALT 16 01/16/2024   PROT 7.5 01/16/2024   ALBUMIN 3.5  03/12/2023   CALCIUM 8.9 01/16/2024   GFRAA >60 08/18/2016   QFTBGOLDPLUS NEGATIVE 01/31/2023    Speciality Comments: Humira  started 02/25/23  Procedures:  No  procedures performed Allergies: Hydroxychloroquine   Assessment / Plan:     Visit Diagnoses: Rheumatoid arthritis involving both hands with positive rheumatoid factor (HCC) - Plan: predniSONE  (DELTASONE ) 5 MG tablet, methotrexate  50 MG/2ML injection, Sedimentation rate Chronic rheumatoid arthritis with recent flare-up, presenting with hand pain and stiffness. Inflammation markers likely elevated. - Checked labs for methotrexate  monitoring and inflammation markers. - Continue methotrexate  20 mg [place] weekly and folic acid  1 mg daily - Continue prednisone  5 mg daily  High risk medication use - Plan: CBC with Differential/Platelet, Comprehensive metabolic panel with GFR Appears to be tolerating medications well.  No serious interval infections.  Headache related problems unclear if affected before vs after rinvoq . - Checking CBC and CMP for medication monitoring continue long-term use of methotrexate     Myofascial pain - On Lyrica and Cymbalta for this already - Plan: cyclobenzaprine  (FLEXERIL ) 5 MG tablet Chronic myofascial pain in the right hip and groin, likely muscular with tightness in hip flexors and restricted hip rotation. - Consider prednisone  taper for 12 days to address inflammation. - Continue flexeril  5 mg daily - Continue current use of heat and topical cream. - Consider hip injection if oral medication is ineffective.   Orders: Orders Placed This Encounter  Procedures   Sedimentation rate   CBC with Differential/Platelet   Comprehensive metabolic panel with GFR   Meds ordered this encounter  Medications   cyclobenzaprine  (FLEXERIL ) 5 MG tablet    Sig: Take 1 tablet (5 mg total) by mouth at bedtime as needed for muscle spasms.    Dispense:  90 tablet    Refill:  0   predniSONE  (DELTASONE ) 5 MG tablet    Sig: Take 1 tablet (5 mg total) by mouth daily with breakfast.    Dispense:  90 tablet    Refill:  0   predniSONE  (DELTASONE ) 5 MG tablet    Sig: Take 4 tablets (20 mg  total) by mouth daily with breakfast for 3 days, THEN 3 tablets (15 mg total) daily with breakfast for 3 days, THEN 2 tablets (10 mg total) daily with breakfast for 3 days, THEN 1 tablet (5 mg total) daily with breakfast for 3 days.    Dispense:  30 tablet    Refill:  0   methotrexate  50 MG/2ML injection    Sig: Inject 0.8 mLs (20 mg total) into the skin once a week.    Dispense:  10 mL    Refill:  0     Follow-Up Instructions: Return in about 3 months (around 07/16/2024) for RA on MTX/GC f/u 3mos.   Lonni LELON Ester, MD  Note - This record has been created using [employer].  Chart creation errors have been sought, but may not always  have been located. Such creation errors do not reflect on  the standard of medical care. "

## 2024-04-17 ENCOUNTER — Encounter: Payer: Self-pay | Admitting: Internal Medicine

## 2024-04-17 ENCOUNTER — Ambulatory Visit: Admitting: Internal Medicine

## 2024-04-17 VITALS — BP 111/69 | HR 84 | Temp 97.5°F | Resp 14 | Ht 61.0 in | Wt 195.0 lb

## 2024-04-17 DIAGNOSIS — R739 Hyperglycemia, unspecified: Secondary | ICD-10-CM

## 2024-04-17 DIAGNOSIS — M7918 Myalgia, other site: Secondary | ICD-10-CM

## 2024-04-17 DIAGNOSIS — Z79899 Other long term (current) drug therapy: Secondary | ICD-10-CM | POA: Diagnosis not present

## 2024-04-17 DIAGNOSIS — M351 Other overlap syndromes: Secondary | ICD-10-CM | POA: Diagnosis not present

## 2024-04-17 DIAGNOSIS — M05742 Rheumatoid arthritis with rheumatoid factor of left hand without organ or systems involvement: Secondary | ICD-10-CM

## 2024-04-17 DIAGNOSIS — M05741 Rheumatoid arthritis with rheumatoid factor of right hand without organ or systems involvement: Secondary | ICD-10-CM | POA: Diagnosis not present

## 2024-04-17 MED ORDER — CYCLOBENZAPRINE HCL 5 MG PO TABS: 5.0000 mg | ORAL_TABLET | Freq: Every evening | ORAL | 0 refills | Status: AC | PRN

## 2024-04-17 MED ORDER — METHOTREXATE SODIUM CHEMO INJECTION 50 MG/2ML: 20.0000 mg | INTRAMUSCULAR | 0 refills | Status: AC

## 2024-04-17 MED ORDER — PREDNISONE 5 MG PO TABS: 5.0000 mg | ORAL_TABLET | Freq: Every day | ORAL | 0 refills | Status: AC

## 2024-04-17 MED ORDER — PREDNISONE 5 MG PO TABS: ORAL_TABLET | ORAL | 0 refills | Status: DC

## 2024-04-18 LAB — COMPREHENSIVE METABOLIC PANEL WITH GFR
AG Ratio: 1.2 (calc) (ref 1.0–2.5)
ALT: 21 U/L (ref 6–29)
AST: 16 U/L (ref 10–35)
Albumin: 4.3 g/dL (ref 3.6–5.1)
Alkaline phosphatase (APISO): 31 U/L (ref 31–125)
BUN: 10 mg/dL (ref 7–25)
CO2: 22 mmol/L (ref 20–32)
Calcium: 9 mg/dL (ref 8.6–10.2)
Chloride: 107 mmol/L (ref 98–110)
Creat: 0.76 mg/dL (ref 0.50–0.99)
Globulin: 3.6 g/dL (ref 1.9–3.7)
Glucose, Bld: 107 mg/dL — ABNORMAL HIGH (ref 65–99)
Potassium: 4.8 mmol/L (ref 3.5–5.3)
Sodium: 135 mmol/L (ref 135–146)
Total Bilirubin: 0.4 mg/dL (ref 0.2–1.2)
Total Protein: 7.9 g/dL (ref 6.1–8.1)
eGFR: 97 mL/min/1.73m2

## 2024-04-18 LAB — CBC WITH DIFFERENTIAL/PLATELET
Absolute Lymphocytes: 888 {cells}/uL (ref 850–3900)
Absolute Monocytes: 368 {cells}/uL (ref 200–950)
Basophils Absolute: 9 {cells}/uL (ref 0–200)
Basophils Relative: 0.2 %
Eosinophils Absolute: 18 {cells}/uL (ref 15–500)
Eosinophils Relative: 0.4 %
HCT: 40 % (ref 35.9–46.0)
Hemoglobin: 13.2 g/dL (ref 11.7–15.5)
MCH: 30.2 pg (ref 27.0–33.0)
MCHC: 33 g/dL (ref 31.6–35.4)
MCV: 91.5 fL (ref 81.4–101.7)
MPV: 10.1 fL (ref 7.5–12.5)
Monocytes Relative: 8 %
Neutro Abs: 3317 {cells}/uL (ref 1500–7800)
Neutrophils Relative %: 72.1 %
Platelets: 361 Thousand/uL (ref 140–400)
RBC: 4.37 Million/uL (ref 3.80–5.10)
RDW: 15.6 % — ABNORMAL HIGH (ref 11.0–15.0)
Total Lymphocyte: 19.3 %
WBC: 4.6 Thousand/uL (ref 3.8–10.8)

## 2024-04-18 LAB — SEDIMENTATION RATE: Sed Rate: 29 mm/h — ABNORMAL HIGH (ref 0–20)

## 2024-04-20 ENCOUNTER — Other Ambulatory Visit: Payer: Self-pay

## 2024-04-20 MED ORDER — PREDNISONE 5 MG PO TABS: ORAL_TABLET | ORAL | 0 refills | Status: AC

## 2024-05-22 ENCOUNTER — Ambulatory Visit: Admitting: Neurology

## 2024-06-02 ENCOUNTER — Encounter: Admitting: Dermatology

## 2024-06-04 ENCOUNTER — Encounter: Admitting: Dermatology

## 2024-07-17 ENCOUNTER — Ambulatory Visit: Admitting: Internal Medicine
